# Patient Record
Sex: Male | Born: 1975 | Race: Black or African American | Hispanic: No | Marital: Single | Smoking: Current every day smoker
Health system: Southern US, Community
[De-identification: ages and names within clinical notes are randomized; demographics above are authoritative.]

## PROBLEM LIST (undated history)

## (undated) DIAGNOSIS — J45909 Unspecified asthma, uncomplicated: Secondary | ICD-10-CM

## (undated) HISTORY — PX: OTHER SURGICAL HISTORY: SHX169

---

## 2004-02-23 ENCOUNTER — Emergency Department: Payer: Self-pay | Admitting: Emergency Medicine

## 2005-03-13 ENCOUNTER — Emergency Department: Payer: Self-pay | Admitting: Emergency Medicine

## 2005-03-30 ENCOUNTER — Emergency Department: Payer: Self-pay | Admitting: General Practice

## 2005-04-01 ENCOUNTER — Emergency Department: Payer: Self-pay | Admitting: Emergency Medicine

## 2005-04-26 ENCOUNTER — Emergency Department: Payer: Self-pay | Admitting: Emergency Medicine

## 2005-05-28 ENCOUNTER — Emergency Department (HOSPITAL_COMMUNITY): Admission: EM | Admit: 2005-05-28 | Discharge: 2005-05-29 | Payer: Self-pay | Admitting: Emergency Medicine

## 2005-07-30 ENCOUNTER — Emergency Department (HOSPITAL_COMMUNITY): Admission: EM | Admit: 2005-07-30 | Discharge: 2005-07-30 | Payer: Self-pay | Admitting: Emergency Medicine

## 2005-08-07 ENCOUNTER — Emergency Department (HOSPITAL_COMMUNITY): Admission: EM | Admit: 2005-08-07 | Discharge: 2005-08-07 | Payer: Self-pay | Admitting: Emergency Medicine

## 2005-08-09 ENCOUNTER — Emergency Department: Payer: Self-pay | Admitting: Emergency Medicine

## 2005-10-17 ENCOUNTER — Emergency Department: Payer: Self-pay | Admitting: Emergency Medicine

## 2005-10-19 ENCOUNTER — Emergency Department (HOSPITAL_COMMUNITY): Admission: EM | Admit: 2005-10-19 | Discharge: 2005-10-19 | Payer: Self-pay | Admitting: Emergency Medicine

## 2005-12-17 ENCOUNTER — Other Ambulatory Visit: Payer: Self-pay

## 2005-12-17 ENCOUNTER — Emergency Department: Payer: Self-pay | Admitting: Unknown Physician Specialty

## 2006-02-22 ENCOUNTER — Emergency Department: Payer: Self-pay | Admitting: Emergency Medicine

## 2006-05-20 ENCOUNTER — Emergency Department: Payer: Self-pay | Admitting: Emergency Medicine

## 2006-05-21 ENCOUNTER — Emergency Department (HOSPITAL_COMMUNITY): Admission: EM | Admit: 2006-05-21 | Discharge: 2006-05-22 | Payer: Self-pay | Admitting: Emergency Medicine
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
## 2008-03-05 ENCOUNTER — Emergency Department: Payer: Self-pay | Admitting: Emergency Medicine

## 2008-10-14 ENCOUNTER — Emergency Department (HOSPITAL_COMMUNITY): Admission: EM | Admit: 2008-10-14 | Discharge: 2008-10-14 | Payer: Self-pay | Admitting: Emergency Medicine

## 2008-10-23 ENCOUNTER — Emergency Department: Payer: Self-pay | Admitting: Emergency Medicine

## 2009-02-20 ENCOUNTER — Emergency Department (HOSPITAL_COMMUNITY): Admission: EM | Admit: 2009-02-20 | Discharge: 2009-02-20 | Payer: Self-pay | Admitting: Emergency Medicine

## 2009-05-04 ENCOUNTER — Emergency Department: Payer: Self-pay

## 2009-05-13 ENCOUNTER — Emergency Department: Payer: Self-pay | Admitting: Emergency Medicine

## 2009-10-29 ENCOUNTER — Emergency Department: Payer: Self-pay | Admitting: Emergency Medicine

## 2010-01-05 ENCOUNTER — Emergency Department: Payer: Self-pay | Admitting: Emergency Medicine

## 2010-01-06 ENCOUNTER — Emergency Department: Payer: Self-pay | Admitting: Emergency Medicine

## 2010-08-17 LAB — URINE MICROSCOPIC-ADD ON

## 2010-08-17 LAB — URINALYSIS, ROUTINE W REFLEX MICROSCOPIC
Nitrite: NEGATIVE
Urobilinogen, UA: 1 mg/dL (ref 0.0–1.0)

## 2010-08-17 LAB — GC/CHLAMYDIA PROBE AMP, GENITAL: GC Probe Amp, Genital: POSITIVE — AB

## 2010-08-17 LAB — URINE CULTURE

## 2011-07-12 ENCOUNTER — Emergency Department: Payer: Self-pay | Admitting: Emergency Medicine

## 2012-07-21 ENCOUNTER — Encounter (HOSPITAL_COMMUNITY): Payer: Self-pay | Admitting: *Deleted

## 2012-07-21 ENCOUNTER — Emergency Department (HOSPITAL_COMMUNITY): Payer: Self-pay

## 2012-07-21 ENCOUNTER — Emergency Department (HOSPITAL_COMMUNITY)
Admission: EM | Admit: 2012-07-21 | Discharge: 2012-07-21 | Disposition: A | Discharge status: Home / Self Care | Payer: No Typology Code available for payment source | Attending: Emergency Medicine | Admitting: Emergency Medicine

## 2012-07-21 DIAGNOSIS — S139XXA Sprain of joints and ligaments of unspecified parts of neck, initial encounter: Secondary | ICD-10-CM | POA: Insufficient documentation

## 2012-07-21 DIAGNOSIS — IMO0002 Reserved for concepts with insufficient information to code with codable children: Secondary | ICD-10-CM | POA: Insufficient documentation

## 2012-07-21 DIAGNOSIS — H538 Other visual disturbances: Secondary | ICD-10-CM | POA: Insufficient documentation

## 2012-07-21 DIAGNOSIS — Y92410 Unspecified street and highway as the place of occurrence of the external cause: Secondary | ICD-10-CM | POA: Insufficient documentation

## 2012-07-21 DIAGNOSIS — Y9389 Activity, other specified: Secondary | ICD-10-CM | POA: Insufficient documentation

## 2012-07-21 DIAGNOSIS — R110 Nausea: Secondary | ICD-10-CM | POA: Insufficient documentation

## 2012-07-21 DIAGNOSIS — F17200 Nicotine dependence, unspecified, uncomplicated: Secondary | ICD-10-CM | POA: Insufficient documentation

## 2012-07-21 DIAGNOSIS — Z8739 Personal history of other diseases of the musculoskeletal system and connective tissue: Secondary | ICD-10-CM | POA: Insufficient documentation

## 2012-07-21 DIAGNOSIS — R42 Dizziness and giddiness: Secondary | ICD-10-CM | POA: Insufficient documentation

## 2012-07-21 DIAGNOSIS — S43401A Unspecified sprain of right shoulder joint, initial encounter: Secondary | ICD-10-CM

## 2012-07-21 MED ORDER — DIAZEPAM 5 MG PO TABS: 5.0000 mg | ORAL_TABLET | Freq: Two times a day (BID) | ORAL | Status: DC

## 2012-07-21 MED ORDER — HYDROMORPHONE HCL PF 1 MG/ML IJ SOLN
1.0000 mg | Freq: Once | INTRAMUSCULAR | Status: AC
  Administered 2012-07-21: 1 mg via INTRAMUSCULAR
  Filled 2012-07-21: qty 1

## 2012-07-21 MED ORDER — KETOROLAC TROMETHAMINE 60 MG/2ML IM SOLN
60.0000 mg | Freq: Once | INTRAMUSCULAR | Status: AC
  Administered 2012-07-21: 60 mg via INTRAMUSCULAR
  Filled 2012-07-21: qty 2

## 2012-07-21 MED ORDER — ONDANSETRON 4 MG PO TBDP
8.0000 mg | ORAL_TABLET | Freq: Once | ORAL | Status: AC
  Administered 2012-07-21: 8 mg via ORAL
  Filled 2012-07-21: qty 2

## 2012-07-21 MED ORDER — OXYCODONE HCL 5 MG PO TABS: 5.0000 mg | ORAL_TABLET | ORAL | Status: DC | PRN

## 2012-07-21 NOTE — ED Notes (Signed)
The pt upset at the wait time.  Hurting  And he has had his xrays
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
## 2012-07-23 NOTE — ED Provider Notes (Signed)
Medical screening examination/treatment/procedure(s) were performed by non-physician practitioner and as supervising physician I was immediately available for consultation/collaboration.  Raeford Razor, MD 07/23/12 2113

## 2012-08-28 ENCOUNTER — Encounter (HOSPITAL_COMMUNITY): Payer: Self-pay | Admitting: Emergency Medicine

## 2012-08-28 DIAGNOSIS — W1809XA Striking against other object with subsequent fall, initial encounter: Secondary | ICD-10-CM | POA: Insufficient documentation

## 2012-08-28 DIAGNOSIS — Y939 Activity, unspecified: Secondary | ICD-10-CM | POA: Insufficient documentation

## 2012-08-28 DIAGNOSIS — Y929 Unspecified place or not applicable: Secondary | ICD-10-CM | POA: Insufficient documentation

## 2012-08-28 DIAGNOSIS — F17200 Nicotine dependence, unspecified, uncomplicated: Secondary | ICD-10-CM | POA: Insufficient documentation

## 2012-08-28 DIAGNOSIS — Z79899 Other long term (current) drug therapy: Secondary | ICD-10-CM | POA: Insufficient documentation

## 2012-08-28 DIAGNOSIS — R269 Unspecified abnormalities of gait and mobility: Secondary | ICD-10-CM | POA: Insufficient documentation

## 2012-08-28 DIAGNOSIS — IMO0002 Reserved for concepts with insufficient information to code with codable children: Secondary | ICD-10-CM | POA: Insufficient documentation

## 2012-08-28 DIAGNOSIS — J45909 Unspecified asthma, uncomplicated: Secondary | ICD-10-CM | POA: Insufficient documentation

## 2012-08-28 NOTE — ED Notes (Signed)
PT. REPORTS LOW BACK PAIN ONSET THIS EVENING WHILE LIFTING HEAVY FURNITURES.

## 2012-08-29 ENCOUNTER — Emergency Department (HOSPITAL_COMMUNITY)
Admission: EM | Admit: 2012-08-29 | Discharge: 2012-08-29 | Disposition: A | Discharge status: Home / Self Care | Payer: Self-pay | Attending: Emergency Medicine | Admitting: Emergency Medicine

## 2012-08-29 DIAGNOSIS — M62830 Muscle spasm of back: Secondary | ICD-10-CM

## 2012-08-29 DIAGNOSIS — M5432 Sciatica, left side: Secondary | ICD-10-CM

## 2012-08-29 DIAGNOSIS — M545 Low back pain: Secondary | ICD-10-CM

## 2012-08-29 HISTORY — DX: Unspecified asthma, uncomplicated: J45.909

## 2012-08-29 MED ORDER — NAPROXEN 500 MG PO TABS: 500.0000 mg | ORAL_TABLET | Freq: Two times a day (BID) | ORAL | Status: DC | PRN

## 2012-08-29 MED ORDER — OXYCODONE HCL 5 MG PO TABS
10.0000 mg | ORAL_TABLET | Freq: Once | ORAL | Status: AC
  Administered 2012-08-29: 10 mg via ORAL
  Filled 2012-08-29: qty 2

## 2012-08-29 MED ORDER — HYDROCODONE-ACETAMINOPHEN 5-325 MG PO TABS: 1.0000 | ORAL_TABLET | Freq: Four times a day (QID) | ORAL | Status: DC | PRN

## 2012-08-29 MED ORDER — OXYCODONE HCL 5 MG PO TABS: 5.0000 mg | ORAL_TABLET | Freq: Three times a day (TID) | ORAL | Status: DC | PRN

## 2012-08-29 MED ORDER — DIAZEPAM 5 MG PO TABS
10.0000 mg | ORAL_TABLET | Freq: Once | ORAL | Status: AC
  Administered 2012-08-29: 10 mg via ORAL
  Filled 2012-08-29: qty 2

## 2012-08-29 MED ORDER — METHOCARBAMOL 750 MG PO TABS: 750.0000 mg | ORAL_TABLET | Freq: Four times a day (QID) | ORAL | Status: DC | PRN

## 2012-08-29 NOTE — ED Provider Notes (Signed)
Medical screening examination/treatment/procedure(s) were performed by non-physician practitioner and as supervising physician I was immediately available for consultation/collaboration.   Anelle Parlow L Devion Chriscoe, MD 08/29/12 0552 

## 2012-08-29 NOTE — ED Provider Notes (Signed)
History     CSN: [id number]  Arrival date & time 08/28/12  2337   First MD Initiated Contact with Patient 08/29/12 0235      Chief Complaint  Patient presents with  . Back Pain    (Consider location/radiation/quality/duration/timing/severity/associated sxs/prior treatment) Patient is a 37 y.o. male presenting with back pain. The history is provided by the patient and medical records. No language interpreter was used.  Back Pain Location:  Lumbar spine Quality:  Aching, burning and cramping Radiates to:  L posterior upper leg Pain severity:  Moderate Pain is:  Unable to specify Onset quality:  Gradual Duration:  4 hours Timing:  Constant Progression:  Worsening Chronicity:  New Context: lifting heavy objects   Context: not falling, not jumping from heights, not occupational injury, not pedestrian accident, not physical stress, not recent illness, not recent injury and not twisting   Relieved by:  None tried Worsened by:  Bending, movement, palpation, sitting and standing Ineffective treatments:  None tried Associated symptoms: no abdominal pain, no bladder incontinence, no bowel incontinence, no chest pain, no dysuria, no fever, no headaches, no leg pain, no numbness, no paresthesias, no perianal numbness, no tingling, no weakness and no weight loss   Risk factors: obesity   Risk factors: no hx of cancer, no recent surgery and no vascular disease     Mark Cruz is a 37 y.o. male  with a hx of asthma presents to the Emergency Department complaining of gradual, persistent, progressively worsening Left sided low back pain onset 4 hours prior to arrival after moving a heavy dresser.  Pt without Hx of back injury or surgery. Pt denies numbness, tingling, loss of bowel or bladder function, difficulty ambulating, loss of leg function.  Pt states pain is sharp, rated at a 10/10 and radiating down the L leg. Associated symptoms include pain in low back with radiation down leg.  Nothing  makes it better and laying on his back makes it worse.  Pt denies fever, chills, headache, neck pain, abdominal pain, nausea, vomiting, diarrhea, weakness, dizziness, syncope, dysuria.  He denies fall or trauma to his back.   Past Medical History  Diagnosis Date  . Asthma     History reviewed. No pertinent past surgical history.  No family history on file.  History  Substance Use Topics  . Smoking status: Current Every Day Smoker  . Smokeless tobacco: Not on file  . Alcohol Use: No      Review of Systems  Constitutional: Negative for fever, weight loss and fatigue.  HENT: Negative for neck pain and neck stiffness.   Respiratory: Negative for chest tightness and shortness of breath.   Cardiovascular: Negative for chest pain.  Gastrointestinal: Negative for nausea, vomiting, abdominal pain, diarrhea and bowel incontinence.  Genitourinary: Negative for bladder incontinence, dysuria, urgency, frequency and hematuria.  Musculoskeletal: Positive for back pain and gait problem ( 2/2 pain ). Negative for joint swelling and arthralgias.  Skin: Negative for rash.  Neurological: Negative for tingling, weakness, light-headedness, numbness, headaches and paresthesias.  All other systems reviewed and are negative.    Allergies  Fish allergy and Tylenol  Home Medications   Current Outpatient Rx  Name  Route  Sig  Dispense  Refill  . albuterol (PROVENTIL HFA;VENTOLIN HFA) 108 (90 BASE) MCG/ACT inhaler   Inhalation   Inhale 2 puffs into the lungs every 6 (six) hours as needed for wheezing.         Marland Kitchen HYDROcodone-acetaminophen (NORCO/VICODIN) 5-325 MG per  tablet   Oral   Take 1 tablet by mouth every 6 (six) hours as needed for pain (Take 1 - 2 tablets every 4 - 6 hours.).   10 tablet   0   . methocarbamol (ROBAXIN) 750 MG tablet   Oral   Take 1 tablet (750 mg total) by mouth 4 (four) times daily as needed (Take 1 tablet every 6 hours as needed for muscle spasms.).   20 tablet    0   . naproxen (NAPROSYN) 500 MG tablet   Oral   Take 1 tablet (500 mg total) by mouth 2 (two) times daily as needed.   30 tablet   0     BP 154/87  Pulse 75  Temp(Src) 99.1 F (37.3 C) (Oral)  Resp 14  SpO2 97%  Physical Exam  Nursing note and vitals reviewed. Constitutional: He is oriented to person, place, and time. He appears well-developed and well-nourished. No distress.  HENT:  Head: Normocephalic and atraumatic.  Mouth/Throat: Oropharynx is clear and moist. No oropharyngeal exudate.  Eyes: Conjunctivae are normal. Pupils are equal, round, and reactive to light.  Neck: Normal range of motion and full passive range of motion without pain. Neck supple. No spinous process tenderness and no muscular tenderness present. Normal range of motion present.  Full ROM without pain  Cardiovascular: Normal rate, regular rhythm, normal heart sounds and intact distal pulses.   No murmur heard. Pulmonary/Chest: Effort normal and breath sounds normal. No respiratory distress. He has no wheezes.  Abdominal: Soft. He exhibits no distension. There is no tenderness.  Musculoskeletal: He exhibits tenderness.       Lumbar back: He exhibits tenderness, pain and spasm. He exhibits no bony tenderness, no swelling, no edema, no deformity and no laceration.       Back:  Full range of motion of the T-spine and mildly decreased ROM of the L-spine 2/2 pain No tenderness to palpation of the spinous processes of the T-spine or L-spine Mild tenderness to palpation of the paraspinous muscles of the Left side of the L-spine and Left SI joint, reproducing radiation of pain  Lymphadenopathy:    He has no cervical adenopathy.  Neurological: He is alert and oriented to person, place, and time. He has normal reflexes. He exhibits normal muscle tone. Coordination normal.  Speech is clear and goal oriented, follows commands Normal strength in upper and lower extremities bilaterally including dorsiflexion and  plantar flexion, strong and equal grip strength Sensation normal to light and sharp touch Moves extremities without ataxia, coordination intact Normal gait Normal balance   Skin: Skin is warm and dry. No rash noted. He is not diaphoretic. No erythema.    ED Course  Procedures (including critical care time)  Labs Reviewed - No data to display No results found.   1. Back muscle spasm   2. Low back pain   3. Sciatica, left [724.3]       MDM  Ames Coupe presents with back pain; history and physical consistent with sciatica. Patient with back pain 2/2 heavy lifting.  No neurological deficits and normal neuro exam.  Patient can walk but states is painful.  No loss of bowel or bladder control.  No concern for cauda equina.  No fever, night sweats, weight loss, h/o cancer, IVDU.  No fall or trauma; no imaging indicated at this time.  RICE protocol and pain medicine indicated and discussed with patient.   1. Medications: robaxin, naproxyn, vicodin, usual home medications 2. Treatment:  rest, drink plenty of fluids, gentle stretching as discussed, alternate ice and heat 3. Follow Up: Please followup with your primary doctor for discussion of your diagnoses and further evaluation after today's visit; if you do not have a primary care doctor use the resource guide provided to find one;           Dierdre Forth, PA-C 08/29/12 0335

## 2012-08-30 ENCOUNTER — Emergency Department (HOSPITAL_COMMUNITY): Payer: Self-pay

## 2012-08-30 ENCOUNTER — Encounter (HOSPITAL_COMMUNITY): Payer: Self-pay | Admitting: *Deleted

## 2012-08-30 ENCOUNTER — Emergency Department (HOSPITAL_COMMUNITY)
Admission: EM | Admit: 2012-08-30 | Discharge: 2012-08-30 | Disposition: A | Discharge status: Home / Self Care | Payer: Self-pay | Attending: Emergency Medicine | Admitting: Emergency Medicine

## 2012-08-30 DIAGNOSIS — G8911 Acute pain due to trauma: Secondary | ICD-10-CM | POA: Insufficient documentation

## 2012-08-30 DIAGNOSIS — J45909 Unspecified asthma, uncomplicated: Secondary | ICD-10-CM | POA: Insufficient documentation

## 2012-08-30 DIAGNOSIS — M5430 Sciatica, unspecified side: Secondary | ICD-10-CM | POA: Insufficient documentation

## 2012-08-30 DIAGNOSIS — M545 Low back pain: Secondary | ICD-10-CM

## 2012-08-30 DIAGNOSIS — S39012A Strain of muscle, fascia and tendon of lower back, initial encounter: Secondary | ICD-10-CM

## 2012-08-30 DIAGNOSIS — Z87828 Personal history of other (healed) physical injury and trauma: Secondary | ICD-10-CM | POA: Insufficient documentation

## 2012-08-30 DIAGNOSIS — F17200 Nicotine dependence, unspecified, uncomplicated: Secondary | ICD-10-CM | POA: Insufficient documentation

## 2012-08-30 MED ORDER — TRAMADOL HCL 50 MG PO TABS
50.0000 mg | ORAL_TABLET | Freq: Once | ORAL | Status: AC
  Administered 2012-08-30: 50 mg via ORAL
  Filled 2012-08-30: qty 1

## 2012-08-30 MED ORDER — PREDNISONE 20 MG PO TABS: ORAL_TABLET | ORAL | Status: DC

## 2012-08-30 MED ORDER — TRAMADOL HCL 50 MG PO TABS: 50.0000 mg | ORAL_TABLET | Freq: Four times a day (QID) | ORAL | Status: DC | PRN

## 2012-08-30 MED ORDER — PREDNISONE 20 MG PO TABS
60.0000 mg | ORAL_TABLET | Freq: Once | ORAL | Status: AC
  Administered 2012-08-30: 60 mg via ORAL
  Filled 2012-08-30: qty 3

## 2012-08-30 NOTE — ED Provider Notes (Signed)
History     CSN: [id number]  Arrival date & time 08/30/12  1430   First MD Initiated Contact with Patient 08/30/12 1508      Chief Complaint  Patient presents with  . Back Pain    (Consider location/radiation/quality/duration/timing/severity/associated sxs/prior treatment) Patient is a 37 y.o. male presenting with back pain. The history is provided by the patient.  Back Pain Associated symptoms: no abdominal pain, no dysuria, no fever, no numbness and no weakness   pt indicates 2 days ago was lifting heavy dresser, noted pull, pain, strain to lower back. C/o dull, mod-sev lower back pain since that occasionally radiates towards bil buttocks/upper legs posteriorly. Worse w bending and certain movement.  No leg numbness or weakness. No perineal or saddle numbness or loss of sensation. No urinary incontinence or retention. No hx ddd. State he was seen in ed prior but no xrays done and was only given 1-2 days of pain meds which he is out of. No reinjury since initial injury. Ambulatory. No fever or chills. Denies any other pain or injury.   Past Medical History  Diagnosis Date  . Asthma     History reviewed. No pertinent past surgical history.  History reviewed. No pertinent family history.  History  Substance Use Topics  . Smoking status: Current Every Day Smoker  . Smokeless tobacco: Not on file  . Alcohol Use: No      Review of Systems  Constitutional: Negative for fever and chills.  HENT: Negative for neck pain.   Gastrointestinal: Negative for nausea, vomiting, abdominal pain and abdominal distention.  Genitourinary: Negative for dysuria and hematuria.  Musculoskeletal: Positive for back pain.  Skin: Negative for rash.  Neurological: Negative for weakness and numbness.    Allergies  Fish allergy and Tylenol  Home Medications   Current Outpatient Rx  Name  Route  Sig  Dispense  Refill  . albuterol (PROVENTIL HFA;VENTOLIN HFA) 108 (90 BASE) MCG/ACT inhaler  Inhalation   Inhale 2 puffs into the lungs every 6 (six) hours as needed for wheezing.         . cyclobenzaprine (FLEXERIL) 10 MG tablet   Oral   Take 10 mg by mouth 3 (three) times daily as needed for muscle spasms.         Marland Kitchen oxyCODONE (OXY IR/ROXICODONE) 5 MG immediate release tablet   Oral   Take 5 mg by mouth every 8 (eight) hours as needed for pain.           BP 142/89  Pulse 73  Temp(Src) 98.4 F (36.9 C) (Oral)  Resp 18  SpO2 100%  Physical Exam  Nursing note and vitals reviewed. Constitutional: He is oriented to person, place, and time. He appears well-developed and well-nourished. No distress.  HENT:  Head: Atraumatic.  Eyes: Pupils are equal, round, and reactive to light.  Neck: Neck supple. No tracheal deviation present.  Cardiovascular: Normal rate.   Pulmonary/Chest: Effort normal. No accessory muscle usage. No respiratory distress.  Abdominal: Soft. He exhibits no distension. There is no tenderness.  Musculoskeletal: Normal range of motion. He exhibits no edema and no tenderness.  Mid to lower lumbar tenderness, otherwise CTLS spine, non tender, aligned, no step off.   Neurological: He is alert and oriented to person, place, and time. He displays normal reflexes.  Straight leg raise neg bil. Motor 5/5. Normal dorsi/plantar flexion at ankles, and great toe. Ambulates w steady gait. Sensation grossly intact/normal.    Skin: Skin is warm and  dry.  Psychiatric: He has a normal mood and affect.    ED Course  Procedures (including critical care time)   Dg Lumbar Spine Complete  08/30/2012  *RADIOLOGY REPORT*  Clinical Data: Injured back.  LUMBAR SPINE - COMPLETE 4+ VIEW  Comparison: None  Findings: The lateral film demonstrates normal alignment. Vertebral bodies and disc spaces are maintained.  No acute bony findings.  Normal alignment of the facet joints and no pars defects.  The visualized bony pelvis in intact.  IMPRESSION: Normal alignment and no acute  bony findings or degenerative changes.   Original Report Authenticated By: Rudie Meyer, M.D.       MDM  Pt has ride, does not have to drive. No meds pta. Ultram po. pred po.  Xr.  Reviewed nursing notes and prior charts for additional history.    Recheck pt comfortable. Appears stable for d/c. Discussed diff dx incl ddd, hern disc, muscle strain/spasm, sciatica, and need for close pcp follow up.   Pt appears stable for d/c.        Suzi Roots, MD 08/30/12 [phone]

## 2012-08-30 NOTE — ED Notes (Signed)
Patient transported to X-ray 

## 2012-08-30 NOTE — ED Notes (Signed)
Pt was seen here recently for same. Was doing heavy lifting on Thursday and now  Having lower back pain that is radiating down bilateral legs. Pt is out of pain meds. Ambulatory at triage.

## 2012-10-28 ENCOUNTER — Encounter (HOSPITAL_COMMUNITY): Payer: Self-pay | Admitting: Physical Medicine and Rehabilitation

## 2012-10-28 ENCOUNTER — Emergency Department (HOSPITAL_COMMUNITY)
Admission: EM | Admit: 2012-10-28 | Discharge: 2012-10-28 | Disposition: A | Discharge status: Home / Self Care | Payer: Self-pay | Attending: Emergency Medicine | Admitting: Emergency Medicine

## 2012-10-28 ENCOUNTER — Telehealth (HOSPITAL_COMMUNITY): Payer: Self-pay | Admitting: *Deleted

## 2012-10-28 ENCOUNTER — Emergency Department (HOSPITAL_COMMUNITY): Payer: Self-pay

## 2012-10-28 DIAGNOSIS — J45909 Unspecified asthma, uncomplicated: Secondary | ICD-10-CM | POA: Insufficient documentation

## 2012-10-28 DIAGNOSIS — S0990XA Unspecified injury of head, initial encounter: Secondary | ICD-10-CM | POA: Insufficient documentation

## 2012-10-28 DIAGNOSIS — Z79899 Other long term (current) drug therapy: Secondary | ICD-10-CM | POA: Insufficient documentation

## 2012-10-28 DIAGNOSIS — H538 Other visual disturbances: Secondary | ICD-10-CM | POA: Insufficient documentation

## 2012-10-28 DIAGNOSIS — F17200 Nicotine dependence, unspecified, uncomplicated: Secondary | ICD-10-CM | POA: Insufficient documentation

## 2012-10-28 MED ORDER — OXYCODONE HCL 5 MG PO TABS
5.0000 mg | ORAL_TABLET | Freq: Once | ORAL | Status: AC
  Administered 2012-10-28: 5 mg via ORAL
  Filled 2012-10-28: qty 1

## 2012-10-28 MED ORDER — OXYCODONE HCL 5 MG PO TABS: 5.0000 mg | ORAL_TABLET | ORAL | Status: DC | PRN

## 2012-10-28 NOTE — ED Notes (Signed)
Patient transported to CT 
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
## 2012-10-29 NOTE — ED Provider Notes (Signed)
Medical screening examination/treatment/procedure(s) were performed by non-physician practitioner and as supervising physician I was immediately available for consultation/collaboration.   Lindalee Huizinga M Joesiah Lonon, DO 10/29/12 0950 

## 2012-11-20 ENCOUNTER — Emergency Department (HOSPITAL_COMMUNITY)
Admission: EM | Admit: 2012-11-20 | Discharge: 2012-11-20 | Disposition: A | Discharge status: Home / Self Care | Payer: Self-pay | Attending: Emergency Medicine | Admitting: Emergency Medicine

## 2012-11-20 ENCOUNTER — Encounter (HOSPITAL_COMMUNITY): Payer: Self-pay | Admitting: *Deleted

## 2012-11-20 DIAGNOSIS — Z87828 Personal history of other (healed) physical injury and trauma: Secondary | ICD-10-CM | POA: Insufficient documentation

## 2012-11-20 DIAGNOSIS — J45909 Unspecified asthma, uncomplicated: Secondary | ICD-10-CM | POA: Insufficient documentation

## 2012-11-20 DIAGNOSIS — R51 Headache: Secondary | ICD-10-CM | POA: Insufficient documentation

## 2012-11-20 DIAGNOSIS — H53149 Visual discomfort, unspecified: Secondary | ICD-10-CM | POA: Insufficient documentation

## 2012-11-20 DIAGNOSIS — Z79899 Other long term (current) drug therapy: Secondary | ICD-10-CM | POA: Insufficient documentation

## 2012-11-20 DIAGNOSIS — F17200 Nicotine dependence, unspecified, uncomplicated: Secondary | ICD-10-CM | POA: Insufficient documentation

## 2012-11-20 MED ORDER — CYCLOBENZAPRINE HCL 10 MG PO TABS: 10.0000 mg | ORAL_TABLET | Freq: Two times a day (BID) | ORAL | Status: DC | PRN

## 2012-11-20 MED ORDER — METOCLOPRAMIDE HCL 5 MG/ML IJ SOLN
10.0000 mg | Freq: Once | INTRAMUSCULAR | Status: AC
  Administered 2012-11-20: 10 mg via INTRAVENOUS
  Filled 2012-11-20: qty 2

## 2012-11-20 MED ORDER — HYDROMORPHONE HCL PF 1 MG/ML IJ SOLN
1.0000 mg | Freq: Once | INTRAMUSCULAR | Status: AC
  Administered 2012-11-20: 1 mg via INTRAVENOUS
  Filled 2012-11-20: qty 1

## 2012-11-20 MED ORDER — SODIUM CHLORIDE 0.9 % IV BOLUS (SEPSIS)
1000.0000 mL | Freq: Once | INTRAVENOUS | Status: AC
  Administered 2012-11-20: 1000 mL via INTRAVENOUS

## 2012-11-20 MED ORDER — DIPHENHYDRAMINE HCL 50 MG/ML IJ SOLN
25.0000 mg | Freq: Once | INTRAMUSCULAR | Status: AC
  Administered 2012-11-20: 25 mg via INTRAVENOUS
  Filled 2012-11-20: qty 1

## 2012-11-20 NOTE — ED Provider Notes (Signed)
Medical screening examination/treatment/procedure(s) were performed by non-physician practitioner and as supervising physician I was immediately available for consultation/collaboration.   Charles B. Bernette Mayers, MD 11/20/12 1118
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
## 2012-12-07 ENCOUNTER — Emergency Department (HOSPITAL_COMMUNITY)
Admission: EM | Admit: 2012-12-07 | Discharge: 2012-12-07 | Disposition: A | Discharge status: Home / Self Care | Payer: Self-pay | Attending: Emergency Medicine | Admitting: Emergency Medicine

## 2012-12-07 ENCOUNTER — Encounter (HOSPITAL_COMMUNITY): Payer: Self-pay | Admitting: Emergency Medicine

## 2012-12-07 DIAGNOSIS — F17200 Nicotine dependence, unspecified, uncomplicated: Secondary | ICD-10-CM | POA: Insufficient documentation

## 2012-12-07 DIAGNOSIS — R51 Headache: Secondary | ICD-10-CM | POA: Insufficient documentation

## 2012-12-07 DIAGNOSIS — J45909 Unspecified asthma, uncomplicated: Secondary | ICD-10-CM | POA: Insufficient documentation

## 2012-12-07 DIAGNOSIS — Z79899 Other long term (current) drug therapy: Secondary | ICD-10-CM | POA: Insufficient documentation

## 2012-12-07 DIAGNOSIS — Z87828 Personal history of other (healed) physical injury and trauma: Secondary | ICD-10-CM | POA: Insufficient documentation

## 2012-12-07 MED ORDER — SUMATRIPTAN SUCCINATE 25 MG PO TABS: 25.0000 mg | ORAL_TABLET | ORAL | Status: DC | PRN

## 2012-12-07 MED ORDER — METOCLOPRAMIDE HCL 5 MG/ML IJ SOLN
10.0000 mg | Freq: Once | INTRAMUSCULAR | Status: AC
  Administered 2012-12-07: 10 mg via INTRAVENOUS
  Filled 2012-12-07: qty 2

## 2012-12-07 MED ORDER — DIPHENHYDRAMINE HCL 50 MG/ML IJ SOLN
25.0000 mg | Freq: Once | INTRAMUSCULAR | Status: DC
  Filled 2012-12-07: qty 1

## 2012-12-07 NOTE — ED Notes (Signed)
PT. REPORTS INTERMITTENT HEADACHE ONSET Friday , DENIES INJURY OR FALL , ALERT AND ORIENTED , DENIES NAUSEA OR BLURRED VISION .
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
## 2012-12-08 NOTE — ED Provider Notes (Signed)
Medical screening examination/treatment/procedure(s) were performed by non-physician practitioner and as supervising physician I was immediately available for consultation/collaboration.  Aleyna Cueva, MD 12/08/12 1834 

## 2012-12-09 ENCOUNTER — Emergency Department (HOSPITAL_COMMUNITY)
Admission: EM | Admit: 2012-12-09 | Discharge: 2012-12-09 | Disposition: A | Discharge status: Home / Self Care | Payer: No Typology Code available for payment source | Attending: Emergency Medicine | Admitting: Emergency Medicine

## 2012-12-09 ENCOUNTER — Encounter (HOSPITAL_COMMUNITY): Payer: Self-pay | Admitting: Emergency Medicine

## 2012-12-09 ENCOUNTER — Emergency Department (HOSPITAL_COMMUNITY): Payer: No Typology Code available for payment source

## 2012-12-09 DIAGNOSIS — S060X1A Concussion with loss of consciousness of 30 minutes or less, initial encounter: Secondary | ICD-10-CM | POA: Insufficient documentation

## 2012-12-09 DIAGNOSIS — F17200 Nicotine dependence, unspecified, uncomplicated: Secondary | ICD-10-CM | POA: Insufficient documentation

## 2012-12-09 DIAGNOSIS — S0990XA Unspecified injury of head, initial encounter: Secondary | ICD-10-CM | POA: Insufficient documentation

## 2012-12-09 DIAGNOSIS — S0003XA Contusion of scalp, initial encounter: Secondary | ICD-10-CM

## 2012-12-09 DIAGNOSIS — Y92410 Unspecified street and highway as the place of occurrence of the external cause: Secondary | ICD-10-CM | POA: Insufficient documentation

## 2012-12-09 DIAGNOSIS — M542 Cervicalgia: Secondary | ICD-10-CM

## 2012-12-09 DIAGNOSIS — S0993XA Unspecified injury of face, initial encounter: Secondary | ICD-10-CM | POA: Insufficient documentation

## 2012-12-09 DIAGNOSIS — Y9389 Activity, other specified: Secondary | ICD-10-CM | POA: Insufficient documentation

## 2012-12-09 DIAGNOSIS — J45909 Unspecified asthma, uncomplicated: Secondary | ICD-10-CM | POA: Insufficient documentation

## 2012-12-09 MED ORDER — METHOCARBAMOL 500 MG PO TABS: 500.0000 mg | ORAL_TABLET | Freq: Three times a day (TID) | ORAL | Status: DC

## 2012-12-09 MED ORDER — CYCLOBENZAPRINE HCL 10 MG PO TABS: 10.0000 mg | ORAL_TABLET | Freq: Two times a day (BID) | ORAL | Status: DC | PRN

## 2012-12-09 MED ORDER — METHOCARBAMOL 500 MG PO TABS
500.0000 mg | ORAL_TABLET | Freq: Once | ORAL | Status: AC
  Administered 2012-12-09: 500 mg via ORAL
  Filled 2012-12-09: qty 1

## 2012-12-09 MED ORDER — CYCLOBENZAPRINE HCL 10 MG PO TABS: 10.0000 mg | ORAL_TABLET | Freq: Once | ORAL | Status: DC
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
## 2012-12-10 NOTE — ED Provider Notes (Signed)
Medical screening examination/treatment/procedure(s) were performed by non-physician practitioner and as supervising physician I was immediately available for consultation/collaboration.   Tzion Wedel, MD 12/10/12 1519 

## 2013-01-20 ENCOUNTER — Encounter (HOSPITAL_COMMUNITY): Payer: Self-pay | Admitting: Emergency Medicine

## 2013-01-20 ENCOUNTER — Emergency Department (HOSPITAL_COMMUNITY)
Admission: EM | Admit: 2013-01-20 | Discharge: 2013-01-21 | Disposition: A | Discharge status: Home / Self Care | Payer: Self-pay | Attending: Emergency Medicine | Admitting: Emergency Medicine

## 2013-01-20 DIAGNOSIS — J45909 Unspecified asthma, uncomplicated: Secondary | ICD-10-CM | POA: Insufficient documentation

## 2013-01-20 DIAGNOSIS — M545 Low back pain, unspecified: Secondary | ICD-10-CM | POA: Insufficient documentation

## 2013-01-20 DIAGNOSIS — N390 Urinary tract infection, site not specified: Secondary | ICD-10-CM | POA: Insufficient documentation

## 2013-01-20 DIAGNOSIS — F17200 Nicotine dependence, unspecified, uncomplicated: Secondary | ICD-10-CM | POA: Insufficient documentation

## 2013-01-20 LAB — URINE MICROSCOPIC-ADD ON

## 2013-01-20 LAB — URINALYSIS, ROUTINE W REFLEX MICROSCOPIC
Glucose, UA: NEGATIVE mg/dL
pH: 6 (ref 5.0–8.0)

## 2013-01-20 MED ORDER — METRONIDAZOLE 500 MG PO TABS
2000.0000 mg | ORAL_TABLET | Freq: Once | ORAL | Status: AC
  Administered 2013-01-20: 2000 mg via ORAL
  Filled 2013-01-20: qty 4

## 2013-01-20 MED ORDER — CIPROFLOXACIN HCL 500 MG PO TABS: 500.0000 mg | ORAL_TABLET | Freq: Two times a day (BID) | ORAL | Status: DC

## 2013-01-20 MED ORDER — KETOROLAC TROMETHAMINE 30 MG/ML IJ SOLN
30.0000 mg | Freq: Once | INTRAMUSCULAR | Status: AC
  Administered 2013-01-20: 30 mg via INTRAMUSCULAR
  Filled 2013-01-20: qty 1

## 2013-01-20 MED ORDER — AZITHROMYCIN 250 MG PO TABS
1000.0000 mg | ORAL_TABLET | Freq: Once | ORAL | Status: AC
  Administered 2013-01-20: 1000 mg via ORAL
  Filled 2013-01-20: qty 3
  Filled 2013-01-20: qty 4

## 2013-01-20 MED ORDER — CEFTRIAXONE SODIUM 250 MG IJ SOLR
250.0000 mg | Freq: Once | INTRAMUSCULAR | Status: AC
  Administered 2013-01-20: 250 mg via INTRAMUSCULAR
  Filled 2013-01-20: qty 250

## 2013-01-20 NOTE — ED Provider Notes (Signed)
CSN: [id number]     Arrival date & time 01/20/13  2155 History   This chart was scribed for non-physician practitioner, Ivonne Andrew, PA-C, working with Lyanne Co, MD by Ronal Fear, ED scribe. This patient was seen in room WTR6/WTR6 and the patient's care was started at 11:04 PM.    Chief Complaint  Patient presents with  . Flank Pain   The history is provided by the patient. No language interpreter was used.    HPI Comments: History provided by the patient.  Mark Cruz is a 37 y.o. male who presents to the Emergency Department complaining of sharp back pain localized at his mid lower back.  Symptoms first began earlier today. They described as aching and spasm-type pain. Patient denies any strenuous activity or trauma. Denies similar symptoms previously. Patient has not used any treatment for symptoms. Denies any specific aggravating or alleviating factors. Pt is sexually active. Pt denies any STI symptom, fever, chills, nausea, emesis, penile discharge, tenderness to testicles, blood in urine.  Pt denies IV drug use and any other medical problems.       Past Medical History  Diagnosis Date  . Asthma    Past Surgical History  Procedure Laterality Date  . Orchectomy Left    History reviewed. No pertinent family history. History  Substance Use Topics  . Smoking status: Current Every Day Smoker -- 0.50 packs/day for 5 years    Types: Cigarettes  . Smokeless tobacco: Never Used  . Alcohol Use: Yes     Comment: socially    Review of Systems  Constitutional: Negative for fever and chills.  Gastrointestinal: Negative for blood in stool.  Genitourinary: Negative for dysuria, hematuria, discharge, penile swelling, scrotal swelling and testicular pain.  Musculoskeletal: Positive for back pain.  All other systems reviewed and are negative.    Allergies  Fish allergy; Tramadol; Tylenol; and Ibuprofen  Home Medications   Current Outpatient Rx  Name  Route  Sig  Dispense   Refill  . methocarbamol (ROBAXIN) 500 MG tablet   Oral   Take 1 tablet (500 mg total) by mouth 3 (three) times daily.   10 tablet   0    BP 141/86  Pulse 78  Temp(Src) 99 F (37.2 C) (Oral)  Resp 16  Ht 6\' 2"  (1.88 m)  Wt 225 lb (102.059 kg)  BMI 28.88 kg/m2  SpO2 100% Physical Exam  Nursing note and vitals reviewed. Constitutional: He is oriented to person, place, and time. He appears well-developed and well-nourished. No distress.  HENT:  Head: Normocephalic and atraumatic.  Eyes: EOM are normal.  Neck: Neck supple. No tracheal deviation present.  Cardiovascular: Normal rate.   Pulmonary/Chest: Effort normal. No respiratory distress.  Genitourinary: Penis normal.  No lymphadenopathy, normal right testicle, absent left testicle, penis circumcised otherwise normal, no discharge. Mild right sided cva tenderness  Musculoskeletal: Normal range of motion. He exhibits tenderness.  Neurological: He is alert and oriented to person, place, and time.  Skin: Skin is warm and dry.  Psychiatric: He has a normal mood and affect. His behavior is normal.    ED Course  Procedures   Medications  cefTRIAXone (ROCEPHIN) injection 250 mg (250 mg Intramuscular Given 01/20/13 2358)  azithromycin (ZITHROMAX) tablet 1,000 mg (1,000 mg Oral Given 01/20/13 2358)  metroNIDAZOLE (FLAGYL) tablet 2,000 mg (2,000 mg Oral Given 01/20/13 2350)  ketorolac (TORADOL) 30 MG/ML injection 30 mg (30 mg Intramuscular Given 01/20/13 2358)      DIAGNOSTIC STUDIES: Oxygen  Saturation is 100% on RA, normal by my interpretation.    COORDINATION OF CARE: 11:11 PM- Pt advised of plan for treatment including Abx for STI's for a period of 10 days if pain does not subside after 1 week pt is to come back for follow up and pt agrees.   Results for orders placed during the hospital encounter of 01/20/13  URINALYSIS, ROUTINE W REFLEX MICROSCOPIC      Result Value Range   Color, Urine YELLOW  YELLOW   APPearance CLOUDY (*)  CLEAR   Specific Gravity, Urine 1.027  1.005 - 1.030   pH 6.0  5.0 - 8.0   Glucose, UA NEGATIVE  NEGATIVE mg/dL   Hgb urine dipstick TRACE (*) NEGATIVE   Bilirubin Urine NEGATIVE  NEGATIVE   Ketones, ur 15 (*) NEGATIVE mg/dL   Protein, ur 30 (*) NEGATIVE mg/dL   Urobilinogen, UA 0.2  0.0 - 1.0 mg/dL   Nitrite NEGATIVE  NEGATIVE   Leukocytes, UA MODERATE (*) NEGATIVE  URINE MICROSCOPIC-ADD ON      Result Value Range   Squamous Epithelial / LPF RARE  RARE   WBC, UA 21-50  <3 WBC/hpf   Bacteria, UA MANY (*) RARE        MDM   1. UTI (urinary tract infection)      Patient seen and evaluated. He appears well. Denies any symptoms for STD. No other clinical signs concerning for STD. However given age and risk factors will treat for possible STD.    I personally performed the services described in this documentation, which was scribed in my presence. The recorded information has been reviewed and is accurate.   Angus Seller, PA-C 01/21/13 [phone]

## 2013-01-20 NOTE — ED Notes (Addendum)
Pt stated he started having muscle spasms in his back today.  Pt denies any heavy lifting or previous hx of back pain. Pt also denies any GU complaints

## 2013-01-21 ENCOUNTER — Encounter (HOSPITAL_COMMUNITY): Payer: Self-pay | Admitting: Emergency Medicine

## 2013-01-21 NOTE — ED Provider Notes (Signed)
Medical screening examination/treatment/procedure(s) were performed by non-physician practitioner and as supervising physician I was immediately available for consultation/collaboration.  Lyanne Co, MD 01/21/13 [phone]

## 2013-01-22 LAB — URINE CULTURE

## 2013-02-20 ENCOUNTER — Emergency Department (HOSPITAL_COMMUNITY)
Admission: EM | Admit: 2013-02-20 | Discharge: 2013-02-20 | Disposition: A | Discharge status: Home / Self Care | Payer: Self-pay | Attending: Emergency Medicine | Admitting: Emergency Medicine

## 2013-02-20 ENCOUNTER — Encounter (HOSPITAL_COMMUNITY): Payer: Self-pay | Admitting: Emergency Medicine

## 2013-02-20 DIAGNOSIS — M79609 Pain in unspecified limb: Secondary | ICD-10-CM | POA: Insufficient documentation

## 2013-02-20 DIAGNOSIS — J45909 Unspecified asthma, uncomplicated: Secondary | ICD-10-CM | POA: Insufficient documentation

## 2013-02-20 DIAGNOSIS — Z760 Encounter for issue of repeat prescription: Secondary | ICD-10-CM | POA: Insufficient documentation

## 2013-02-20 DIAGNOSIS — M79604 Pain in right leg: Secondary | ICD-10-CM

## 2013-02-20 DIAGNOSIS — F17200 Nicotine dependence, unspecified, uncomplicated: Secondary | ICD-10-CM | POA: Insufficient documentation

## 2013-02-20 MED ORDER — METHOCARBAMOL 500 MG PO TABS: 500.0000 mg | ORAL_TABLET | Freq: Two times a day (BID) | ORAL | Status: DC | PRN

## 2013-02-20 MED ORDER — OXYCODONE HCL 5 MG PO TABS: 5.0000 mg | ORAL_TABLET | ORAL | Status: DC | PRN

## 2013-02-20 MED ORDER — OXYCODONE HCL 5 MG PO CAPS: 5.0000 mg | ORAL_CAPSULE | ORAL | Status: DC | PRN
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
## 2013-02-20 NOTE — ED Provider Notes (Signed)
Medical screening examination/treatment/procedure(s) were performed by non-physician practitioner and as supervising physician I was immediately available for consultation/collaboration.   David Masneri, MD 02/20/13 2144 
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
## 2013-02-24 ENCOUNTER — Encounter (HOSPITAL_COMMUNITY): Payer: Self-pay | Admitting: Emergency Medicine

## 2013-02-24 ENCOUNTER — Emergency Department (HOSPITAL_COMMUNITY)
Admission: EM | Admit: 2013-02-24 | Discharge: 2013-02-24 | Disposition: A | Discharge status: Home / Self Care | Payer: Self-pay | Attending: Emergency Medicine | Admitting: Emergency Medicine

## 2013-02-24 DIAGNOSIS — Z888 Allergy status to other drugs, medicaments and biological substances status: Secondary | ICD-10-CM | POA: Insufficient documentation

## 2013-02-24 DIAGNOSIS — Y92410 Unspecified street and highway as the place of occurrence of the external cause: Secondary | ICD-10-CM | POA: Insufficient documentation

## 2013-02-24 DIAGNOSIS — J45909 Unspecified asthma, uncomplicated: Secondary | ICD-10-CM | POA: Insufficient documentation

## 2013-02-24 DIAGNOSIS — M79609 Pain in unspecified limb: Secondary | ICD-10-CM | POA: Insufficient documentation

## 2013-02-24 DIAGNOSIS — Y939 Activity, unspecified: Secondary | ICD-10-CM | POA: Insufficient documentation

## 2013-02-24 DIAGNOSIS — F17200 Nicotine dependence, unspecified, uncomplicated: Secondary | ICD-10-CM | POA: Insufficient documentation

## 2013-02-24 DIAGNOSIS — IMO0002 Reserved for concepts with insufficient information to code with codable children: Secondary | ICD-10-CM | POA: Insufficient documentation

## 2013-02-24 MED ORDER — OXYCODONE HCL 5 MG PO TABS: 5.0000 mg | ORAL_TABLET | Freq: Four times a day (QID) | ORAL | Status: DC | PRN
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
## 2013-02-25 NOTE — ED Provider Notes (Signed)
Medical screening examination/treatment/procedure(s) were performed by non-physician practitioner and as supervising physician I was immediately available for consultation/collaboration.   Laray Anger, DO 02/25/13 2202

## 2013-03-12 ENCOUNTER — Emergency Department (HOSPITAL_COMMUNITY)
Admission: EM | Admit: 2013-03-12 | Discharge: 2013-03-12 | Disposition: A | Discharge status: Home / Self Care | Payer: Self-pay | Attending: Emergency Medicine | Admitting: Emergency Medicine

## 2013-03-12 ENCOUNTER — Encounter (HOSPITAL_COMMUNITY): Payer: Self-pay | Admitting: Emergency Medicine

## 2013-03-12 DIAGNOSIS — M79604 Pain in right leg: Secondary | ICD-10-CM

## 2013-03-12 DIAGNOSIS — G8921 Chronic pain due to trauma: Secondary | ICD-10-CM | POA: Insufficient documentation

## 2013-03-12 DIAGNOSIS — F17200 Nicotine dependence, unspecified, uncomplicated: Secondary | ICD-10-CM | POA: Insufficient documentation

## 2013-03-12 DIAGNOSIS — J45909 Unspecified asthma, uncomplicated: Secondary | ICD-10-CM | POA: Insufficient documentation

## 2013-03-12 DIAGNOSIS — M79609 Pain in unspecified limb: Secondary | ICD-10-CM | POA: Insufficient documentation

## 2013-03-12 MED ORDER — OXYCODONE HCL 5 MG PO TABS
5.0000 mg | ORAL_TABLET | Freq: Once | ORAL | Status: AC
  Administered 2013-03-12: 5 mg via ORAL
  Filled 2013-03-12: qty 1
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
## 2013-03-14 NOTE — ED Provider Notes (Signed)
I saw and evaluated the patient, reviewed the resident's note and I agree with the findings and plan.  EKG Interpretation   None      Patient with chronic pain. Requesting another medicine refill. Has been here for same. He was not given a refill.   Juliet Rude. Rubin Payor, MD 03/14/13 1517

## 2013-03-24 ENCOUNTER — Emergency Department (HOSPITAL_COMMUNITY): Payer: Self-pay

## 2013-03-24 ENCOUNTER — Encounter (HOSPITAL_COMMUNITY): Payer: Self-pay | Admitting: Emergency Medicine

## 2013-03-24 ENCOUNTER — Emergency Department (HOSPITAL_COMMUNITY)
Admission: EM | Admit: 2013-03-24 | Discharge: 2013-03-24 | Disposition: A | Discharge status: Home / Self Care | Payer: Self-pay | Attending: Emergency Medicine | Admitting: Emergency Medicine

## 2013-03-24 DIAGNOSIS — R059 Cough, unspecified: Secondary | ICD-10-CM | POA: Insufficient documentation

## 2013-03-24 DIAGNOSIS — J029 Acute pharyngitis, unspecified: Secondary | ICD-10-CM | POA: Insufficient documentation

## 2013-03-24 DIAGNOSIS — J069 Acute upper respiratory infection, unspecified: Secondary | ICD-10-CM | POA: Insufficient documentation

## 2013-03-24 DIAGNOSIS — R05 Cough: Secondary | ICD-10-CM | POA: Insufficient documentation

## 2013-03-24 DIAGNOSIS — F17200 Nicotine dependence, unspecified, uncomplicated: Secondary | ICD-10-CM | POA: Insufficient documentation

## 2013-03-24 DIAGNOSIS — J45909 Unspecified asthma, uncomplicated: Secondary | ICD-10-CM | POA: Insufficient documentation

## 2013-03-24 DIAGNOSIS — R0602 Shortness of breath: Secondary | ICD-10-CM | POA: Insufficient documentation

## 2013-03-24 MED ORDER — ALBUTEROL SULFATE (5 MG/ML) 0.5% IN NEBU
5.0000 mg | INHALATION_SOLUTION | Freq: Once | RESPIRATORY_TRACT | Status: AC
  Administered 2013-03-24: 5 mg via RESPIRATORY_TRACT
  Filled 2013-03-24: qty 1

## 2013-03-24 MED ORDER — PREDNISONE 20 MG PO TABS: 60.0000 mg | ORAL_TABLET | Freq: Every day | ORAL | Status: DC

## 2013-03-24 MED ORDER — IPRATROPIUM BROMIDE 0.02 % IN SOLN
0.5000 mg | Freq: Once | RESPIRATORY_TRACT | Status: AC
  Administered 2013-03-24: 0.5 mg via RESPIRATORY_TRACT
  Filled 2013-03-24: qty 2.5

## 2013-03-24 MED ORDER — PREDNISONE 20 MG PO TABS
60.0000 mg | ORAL_TABLET | Freq: Once | ORAL | Status: AC
  Administered 2013-03-24: 60 mg via ORAL
  Filled 2013-03-24: qty 3

## 2013-03-24 MED ORDER — ALBUTEROL SULFATE HFA 108 (90 BASE) MCG/ACT IN AERS: 1.0000 | INHALATION_SPRAY | Freq: Four times a day (QID) | RESPIRATORY_TRACT | Status: AC | PRN

## 2013-03-24 NOTE — ED Provider Notes (Signed)
CSN: [id number]     Arrival date & time 03/24/13  1409 History  This chart was scribed for non-physician practitioner working with Dagmar Hait, MD by Ashley Jacobs, ED scribe. This patient was seen in room WTR9/WTR9 and the patient's care was started at 3:46 PM.   First MD Initiated Contact with Patient 03/24/13 1449     Chief Complaint  Patient presents with  . Chest Pain    chest congestion x 3 days  . Shortness of Breath  . Sore Throat   (Consider location/radiation/quality/duration/timing/severity/associated sxs/prior Treatment) The history is provided by the patient and medical records.   HPI Comments: Mark Cruz is a 37 y.o. male who presents to the Emergency Department complaining of productive cough, SOB, and sore throat for the past three days. Pt has tried Alka-Seizer Cold medication today with no improvements. He also complains of  wheezing, nasal congestion, and chest congestion.   Pt denies fever or chills.  . He has allergies to Tylenol, Ibuprofen, fish and Tramadol. Pt currently smokes 0.5 packs of cigarettes a day. He has a hx of asthma, but denies having an inhaler at this time.  Past Medical History  Diagnosis Date  . Asthma    Past Surgical History  Procedure Laterality Date  . Orchectomy Left    Family History  Problem Relation Age of Onset  . Hypertension Mother   . Hypertension Father    History  Substance Use Topics  . Smoking status: Current Every Day Smoker -- 0.50 packs/day for 5 years    Types: Cigarettes  . Smokeless tobacco: Never Used  . Alcohol Use: Yes     Comment: socially    Review of Systems  HENT: Positive for congestion (chest and nasal) and sore throat.   Respiratory: Positive for cough, shortness of breath and wheezing.   All other systems reviewed and are negative.    Allergies  Fish allergy; Tramadol; Tylenol; and Ibuprofen  Home Medications  No current outpatient prescriptions on file. BP 138/90   Pulse 70  Temp(Src) 98.6 F (37 C) (Oral)  Resp 20  Wt 230 lb (104.327 kg)  SpO2 96% Physical Exam  Nursing note and vitals reviewed. Constitutional: He is oriented to person, place, and time. He appears well-developed and well-nourished. No distress.  HENT:  Head: Normocephalic and atraumatic.  Mouth/Throat: No oropharyngeal exudate.  Mild erythema of the oropharynx No edema of the oropharynx No exudates in the oropharynx No tenderness to palpation of the maxillary or frontal sinuses.   Eyes: EOM are normal. Pupils are equal, round, and reactive to light.  Neck: Normal range of motion. Neck supple. No tracheal deviation present.  Cardiovascular: Normal rate, regular rhythm and normal heart sounds.   Pulmonary/Chest: Effort normal. No respiratory distress. He has wheezes. He has no rales.  Diffuse inspiratory and expiratory wheezing.   Abdominal: Soft. He exhibits no distension.  Musculoskeletal: Normal range of motion.  Neurological: He is alert and oriented to person, place, and time.  Skin: Skin is warm and dry.  Psychiatric: He has a normal mood and affect. His behavior is normal.    ED Course  Procedures (including critical care time) DIAGNOSTIC STUDIES: Oxygen Saturation is 96% on room air, normal by my interpretation.    COORDINATION OF CARE: 3:50 PM Discussed course of care with pt which includes chest x-ray and a 5-day course of Prednisone. Pt understands and agrees.  Labs Review Labs Reviewed - No data to display Imaging Review Dg  Chest 2 View  03/24/2013   CLINICAL DATA:  Dyspnea and chest pain, history of tobacco use.  EXAM: CHEST  2 VIEW  COMPARISON:  Thoracic spine series of May 22, 2006  FINDINGS: The lungs are well-expanded. There is no focal infiltrate. The cardiac silhouette is normal in size. The pulmonary vascularity is not engorged. There is no pleural effusion. There is no evidence of a pneumothorax. The bony thorax also appears normal. .   IMPRESSION: There is no evidence of pneumonia nor CHF. I cannot exclude acute bronchitis with minimal perihilar subsegmental atelectasis in the appropriate clinical setting.   Electronically Signed   By: David  [place]   On: 03/24/2013 15:33    EKG Interpretation   None       MDM  No diagnosis found. Pt CXR negative for acute infiltrate. Patients symptoms are consistent with URI, likely viral etiology. Discussed that antibiotics are not indicated for viral infections. Patient with history of Asthma and wheezing on exam.  Will discharge with Albuterol inhaler and 5 day course of Prednisone.  Pt verbalizes understanding and is agreeable with plan. Pt is hemodynamically stable & in NAD prior to dc.  I personally performed the services described in this documentation, which was scribed in my presence. The recorded information has been reviewed and is accurate.      Santiago Glad, PA-C 03/24/13 [phone]

## 2013-03-24 NOTE — ED Notes (Signed)
Pt reports chest congestion, productive cough, sore throat x 3 days. Sx unresponsive to OTC med Slight inspiratory wheeze noted, posterior

## 2013-03-24 NOTE — Progress Notes (Signed)
Patient confirma he does not have a pcp or insurance.  [facility] provided patient with a list of pcps who accept self pay patients, list of discount pharmacies and website needymeds.org for medication assistance, information regarding Medicaid and Affordable Care Act for insurance, financial assitance in the community such as [employer] Public librarian churches, crisis intervention information, local shelters and dental assistance for patients without insurance.  Patient thankful for resources.

## 2013-03-25 NOTE — ED Provider Notes (Signed)
Medical screening examination/treatment/procedure(s) were performed by non-physician practitioner and as supervising physician I was immediately available for consultation/collaboration.  EKG Interpretation   None         William Milli Woolridge, MD 03/25/13 1508 

## 2013-04-13 ENCOUNTER — Emergency Department (HOSPITAL_COMMUNITY)
Admission: EM | Admit: 2013-04-13 | Discharge: 2013-04-13 | Disposition: A | Discharge status: Home / Self Care | Payer: Self-pay | Attending: Emergency Medicine | Admitting: Emergency Medicine

## 2013-04-13 ENCOUNTER — Encounter (HOSPITAL_COMMUNITY): Payer: Self-pay | Admitting: Emergency Medicine

## 2013-04-13 DIAGNOSIS — J069 Acute upper respiratory infection, unspecified: Secondary | ICD-10-CM | POA: Insufficient documentation

## 2013-04-13 DIAGNOSIS — Z79899 Other long term (current) drug therapy: Secondary | ICD-10-CM | POA: Insufficient documentation

## 2013-04-13 DIAGNOSIS — IMO0002 Reserved for concepts with insufficient information to code with codable children: Secondary | ICD-10-CM | POA: Insufficient documentation

## 2013-04-13 DIAGNOSIS — J45901 Unspecified asthma with (acute) exacerbation: Secondary | ICD-10-CM | POA: Insufficient documentation

## 2013-04-13 DIAGNOSIS — B9789 Other viral agents as the cause of diseases classified elsewhere: Secondary | ICD-10-CM

## 2013-04-13 DIAGNOSIS — F17200 Nicotine dependence, unspecified, uncomplicated: Secondary | ICD-10-CM | POA: Insufficient documentation

## 2013-04-13 DIAGNOSIS — J45909 Unspecified asthma, uncomplicated: Secondary | ICD-10-CM

## 2013-04-13 MED ORDER — HYDROCOD POLST-CHLORPHEN POLST 10-8 MG/5ML PO LQCR: 5.0000 mL | Freq: Two times a day (BID) | ORAL | Status: DC | PRN

## 2013-04-13 MED ORDER — IPRATROPIUM BROMIDE 0.02 % IN SOLN
0.5000 mg | Freq: Once | RESPIRATORY_TRACT | Status: AC
  Administered 2013-04-13: 0.5 mg via RESPIRATORY_TRACT
  Filled 2013-04-13: qty 2.5

## 2013-04-13 MED ORDER — PREDNISONE (PAK) 10 MG PO TABS: ORAL_TABLET | Freq: Every day | ORAL | Status: DC

## 2013-04-13 MED ORDER — ALBUTEROL SULFATE (5 MG/ML) 0.5% IN NEBU
5.0000 mg | INHALATION_SOLUTION | Freq: Once | RESPIRATORY_TRACT | Status: AC
  Administered 2013-04-13: 5 mg via RESPIRATORY_TRACT
  Filled 2013-04-13: qty 1

## 2013-04-13 NOTE — ED Provider Notes (Signed)
CSN: [id number]     Arrival date & time 04/13/13  4098 History   First MD Initiated Contact with Patient 04/13/13 [phone]     Chief Complaint  Patient presents with  . Cough   (Consider location/radiation/quality/duration/timing/severity/associated sxs/prior Treatment) HPI Patient with hx asthma presents with body ache, cough, wheezing, sore throat, nasal congestion ,mild SOB, mild chest tightness that began last night.  Used his inhaler last night with some relief.  Denies fevers.  Denies known sick contacts.    Past Medical History  Diagnosis Date  . Asthma    Past Surgical History  Procedure Laterality Date  . Orchectomy Left    Family History  Problem Relation Age of Onset  . Hypertension Mother   . Hypertension Father    History  Substance Use Topics  . Smoking status: Current Every Day Smoker -- 0.50 packs/day for 5 years    Types: Cigarettes  . Smokeless tobacco: Never Used  . Alcohol Use: Yes     Comment: socially    Review of Systems  Constitutional: Negative for fever and chills.  HENT: Positive for congestion and sore throat. Negative for trouble swallowing.   Respiratory: Positive for cough, chest tightness and shortness of breath.   Cardiovascular: Negative for chest pain, palpitations and leg swelling.  Gastrointestinal: Negative for nausea, vomiting, abdominal pain and diarrhea.  Genitourinary: Negative for dysuria, urgency and frequency.    Allergies  Fish allergy; Tramadol; Tylenol; and Ibuprofen  Home Medications   Current Outpatient Rx  Name  Route  Sig  Dispense  Refill  . albuterol (PROVENTIL HFA;VENTOLIN HFA) 108 (90 BASE) MCG/ACT inhaler   Inhalation   Inhale 1-2 puffs into the lungs every 6 (six) hours as needed for wheezing or shortness of breath.   1 Inhaler   0   . predniSONE (DELTASONE) 20 MG tablet   Oral   Take 3 tablets (60 mg total) by mouth daily.   12 tablet   0    BP 136/66  Pulse 66  Temp(Src) 98.5 F (36.9 C) (Oral)   Resp 20  SpO2 100% Physical Exam  Nursing note and vitals reviewed. Constitutional: He appears well-developed and well-nourished. No distress.  HENT:  Head: Normocephalic and atraumatic.  Mouth/Throat: Oropharynx is clear and moist. No oropharyngeal exudate.  Eyes: Conjunctivae are normal.  Neck: Normal range of motion. Neck supple.  Cardiovascular: Normal rate and regular rhythm.   Pulmonary/Chest: Effort normal. No accessory muscle usage or stridor. Not tachypneic. No respiratory distress. He has no decreased breath sounds. He has wheezes. He has no rhonchi. He has no rales.  Slight end expiratory wheeze  Lymphadenopathy:    He has no cervical adenopathy.  Neurological: He is alert. He exhibits normal muscle tone.  Skin: He is not diaphoretic.    ED Course  Procedures (including critical care time) Labs Review Labs Reviewed - No data to display Imaging Review No results found.  EKG Interpretation   None      Pt reexamined after neb treatment, lungs CTAB  MDM   1. Viral respiratory illness   2. Asthma    Pt with hx asthma and smoking p/w one day respiratory symptoms.  Slight wheeze resolved with albuterol neb.  No rales or ronchi, O2 100% on room air, doubt pneumonia.  D/C home with prednisone, tussionex, continue home albuterol hfa.  Discussed result, findings, treatment, and follow up  with patient.  Pt given return precautions.  Pt verbalizes understanding and agrees with plan.  I doubt any other EMC precluding discharge at this time including, but not necessarily limited to the following: CAP, PE    Trixie Dredge, PA-C 04/13/13 [phone]

## 2013-04-13 NOTE — ED Provider Notes (Signed)
Medical screening examination/treatment/procedure(s) were performed by non-physician practitioner and as supervising physician I was immediately available for consultation/collaboration.  EKG Interpretation   None        Donnetta Hutching, MD 04/13/13 1145

## 2013-04-13 NOTE — ED Notes (Signed)
Patient with history of asthma with wheezing, cough, runny nose, and body aches.  Denies fever, nausea, vomiting.  Has been using his albuterol inhaler.  Coughing up yellowish sputum.   Patient smokes 1/2 ppd.

## 2013-04-13 NOTE — Progress Notes (Signed)
[facility] provided pt with a list of primary care resources and a [facility] Orange Card application.  °

## 2013-06-04 ENCOUNTER — Emergency Department (HOSPITAL_COMMUNITY)
Admission: EM | Admit: 2013-06-04 | Discharge: 2013-06-04 | Disposition: A | Discharge status: Home / Self Care | Payer: Self-pay | Attending: Emergency Medicine | Admitting: Emergency Medicine

## 2013-06-04 ENCOUNTER — Encounter (HOSPITAL_COMMUNITY): Payer: Self-pay | Admitting: Emergency Medicine

## 2013-06-04 ENCOUNTER — Emergency Department (HOSPITAL_COMMUNITY): Payer: Self-pay

## 2013-06-04 DIAGNOSIS — N390 Urinary tract infection, site not specified: Secondary | ICD-10-CM | POA: Insufficient documentation

## 2013-06-04 DIAGNOSIS — F17200 Nicotine dependence, unspecified, uncomplicated: Secondary | ICD-10-CM | POA: Insufficient documentation

## 2013-06-04 DIAGNOSIS — Z79899 Other long term (current) drug therapy: Secondary | ICD-10-CM | POA: Insufficient documentation

## 2013-06-04 DIAGNOSIS — Z87442 Personal history of urinary calculi: Secondary | ICD-10-CM | POA: Insufficient documentation

## 2013-06-04 DIAGNOSIS — J45909 Unspecified asthma, uncomplicated: Secondary | ICD-10-CM | POA: Insufficient documentation

## 2013-06-04 LAB — COMPREHENSIVE METABOLIC PANEL
ALBUMIN: 3.3 g/dL — AB (ref 3.5–5.2)
ALK PHOS: 89 U/L (ref 39–117)
ALT: 19 U/L (ref 0–53)
AST: 18 U/L (ref 0–37)
BILIRUBIN TOTAL: 0.2 mg/dL — AB (ref 0.3–1.2)
BUN: 15 mg/dL (ref 6–23)
CHLORIDE: 100 meq/L (ref 96–112)
CO2: 25 mEq/L (ref 19–32)
Calcium: 8.3 mg/dL — ABNORMAL LOW (ref 8.4–10.5)
Creatinine, Ser: 1.3 mg/dL (ref 0.50–1.35)
GFR calc Af Amer: 80 mL/min — ABNORMAL LOW (ref >90)
GFR calc non Af Amer: 69 mL/min — ABNORMAL LOW (ref >90)
Glucose, Bld: 129 mg/dL — ABNORMAL HIGH (ref 70–99)
POTASSIUM: 4 meq/L (ref 3.7–5.3)
SODIUM: 137 meq/L (ref 137–147)
TOTAL PROTEIN: 6.7 g/dL (ref 6.0–8.3)

## 2013-06-04 LAB — CBC WITH DIFFERENTIAL/PLATELET
BASOS ABS: 0 10*3/uL (ref 0.0–0.1)
BASOS PCT: 0 % (ref 0–1)
EOS ABS: 0.4 10*3/uL (ref 0.0–0.7)
Eosinophils Relative: 4 % (ref 0–5)
HCT: 44.2 % (ref 39.0–52.0)
HEMOGLOBIN: 14.7 g/dL (ref 13.0–17.0)
Lymphocytes Relative: 32 % (ref 12–46)
Lymphs Abs: 2.9 10*3/uL (ref 0.7–4.0)
MCH: 26.8 pg (ref 26.0–34.0)
MCHC: 33.3 g/dL (ref 30.0–36.0)
MCV: 80.5 fL (ref 78.0–100.0)
MONOS PCT: 11 % (ref 3–12)
Monocytes Absolute: 1 10*3/uL (ref 0.1–1.0)
NEUTROS ABS: 4.8 10*3/uL (ref 1.7–7.7)
NEUTROS PCT: 53 % (ref 43–77)
PLATELETS: 251 10*3/uL (ref 150–400)
RBC: 5.49 MIL/uL (ref 4.22–5.81)
RDW: 14 % (ref 11.5–15.5)
WBC: 9 10*3/uL (ref 4.0–10.5)

## 2013-06-04 LAB — URINE MICROSCOPIC-ADD ON

## 2013-06-04 LAB — URINALYSIS, ROUTINE W REFLEX MICROSCOPIC
Bilirubin Urine: NEGATIVE
Glucose, UA: NEGATIVE mg/dL
KETONES UR: NEGATIVE mg/dL
NITRITE: NEGATIVE
PH: 5.5 (ref 5.0–8.0)
Protein, ur: NEGATIVE mg/dL
SPECIFIC GRAVITY, URINE: 1.026 (ref 1.005–1.030)
UROBILINOGEN UA: 0.2 mg/dL (ref 0.0–1.0)

## 2013-06-04 MED ORDER — PROMETHAZINE HCL 25 MG PO TABS: 25.0000 mg | ORAL_TABLET | Freq: Four times a day (QID) | ORAL | Status: DC | PRN

## 2013-06-04 MED ORDER — SULFAMETHOXAZOLE-TRIMETHOPRIM 800-160 MG PO TABS: 1.0000 | ORAL_TABLET | Freq: Two times a day (BID) | ORAL | Status: DC

## 2013-06-04 MED ORDER — ONDANSETRON HCL 4 MG/2ML IJ SOLN
4.0000 mg | Freq: Once | INTRAMUSCULAR | Status: AC
  Administered 2013-06-04: 4 mg via INTRAVENOUS
  Filled 2013-06-04: qty 2

## 2013-06-04 MED ORDER — KETOROLAC TROMETHAMINE 30 MG/ML IJ SOLN
30.0000 mg | Freq: Once | INTRAMUSCULAR | Status: AC
  Administered 2013-06-04: 30 mg via INTRAVENOUS
  Filled 2013-06-04: qty 1

## 2013-06-04 MED ORDER — SODIUM CHLORIDE 0.9 % IV BOLUS (SEPSIS)
1000.0000 mL | Freq: Once | INTRAVENOUS | Status: AC
  Administered 2013-06-04: 1000 mL via INTRAVENOUS

## 2013-06-04 MED ORDER — DIPHENHYDRAMINE HCL 50 MG/ML IJ SOLN
12.5000 mg | Freq: Once | INTRAMUSCULAR | Status: AC
  Administered 2013-06-04: 12.5 mg via INTRAVENOUS
  Filled 2013-06-04: qty 1

## 2013-06-04 NOTE — ED Notes (Signed)
Pt reports similar pain in past with past diagnoses of UTI and kidney stone Sep/Nov 2014. Pt denies odor or blood in urine. Pt given urinal and encouraged to void.

## 2013-06-04 NOTE — ED Notes (Signed)
Right flank pain since 8pm; worse thru night; frequent urination

## 2013-06-04 NOTE — Discharge Instructions (Signed)
Urinary Tract Infection °Urinary tract infections (UTIs) can develop anywhere along your urinary tract. Your urinary tract is your body's drainage system for removing wastes and extra water. Your urinary tract includes two kidneys, two ureters, a bladder, and a urethra. Your kidneys are a pair of bean-shaped organs. Each kidney is about the size of your fist. They are located below your ribs, one on each side of your spine. °CAUSES °Infections are caused by microbes, which are microscopic organisms, including fungi, viruses, and bacteria. These organisms are so small that they can only be seen through a microscope. Bacteria are the microbes that most commonly cause UTIs. °SYMPTOMS  °Symptoms of UTIs may vary by age and gender of the patient and by the location of the infection. Symptoms in young women typically include a frequent and intense urge to urinate and a painful, burning feeling in the bladder or urethra during urination. Older women and men are more likely to be tired, shaky, and weak and have muscle aches and abdominal pain. A fever may mean the infection is in your kidneys. Other symptoms of a kidney infection include pain in your back or sides below the ribs, nausea, and vomiting. °DIAGNOSIS °To diagnose a UTI, your caregiver will ask you about your symptoms. Your caregiver also will ask to provide a urine sample. The urine sample will be tested for bacteria and white blood cells. White blood cells are made by your body to help fight infection. °TREATMENT  °Typically, UTIs can be treated with medication. Because most UTIs are caused by a bacterial infection, they usually can be treated with the use of antibiotics. The choice of antibiotic and length of treatment depend on your symptoms and the type of bacteria causing your infection. °HOME CARE INSTRUCTIONS °· If you were prescribed antibiotics, take them exactly as your caregiver instructs you. Finish the medication even if you feel better after you  have only taken some of the medication. °· Drink enough water and fluids to keep your urine clear or pale yellow. °· Avoid caffeine, tea, and carbonated beverages. They tend to irritate your bladder. °· Empty your bladder often. Avoid holding urine for long periods of time. °· Empty your bladder before and after sexual intercourse. °· After a bowel movement, women should cleanse from front to back. Use each tissue only once. °SEEK MEDICAL CARE IF:  °· You have back pain. °· You develop a fever. °· Your symptoms do not begin to resolve within 3 days. °SEEK IMMEDIATE MEDICAL CARE IF:  °· You have severe back pain or lower abdominal pain. °· You develop chills. °· You have nausea or vomiting. °· You have continued burning or discomfort with urination. °MAKE SURE YOU:  °· Understand these instructions. °· Will watch your condition. °· Will get help right away if you are not doing well or get worse. °Document Released: 02/07/2005 Document Revised: 10/30/2011 Document Reviewed: 06/08/2011 °ExitCare® Patient Information ©2014 [employer]. ° °Emergency Department Resource Guide °1) Find a Doctor and Pay Out of Pocket °Although you won't have to find out who is covered by your insurance plan, it is a good idea to ask around and get recommendations. You will then need to call the office and see if the doctor you have chosen will accept you as a new patient and what types of options they offer for patients who are self-pay. Some doctors offer discounts or will set up payment plans for their patients who do not have insurance, but you will need   to ask so you aren't surprised when you get to your appointment. ° °2) Contact Your Local Health Department °Not all health departments have doctors that can see patients for sick visits, but many do, so it is worth a call to see if yours does. If you don't know where your local health department is, you can check in your phone book. The CDC also has a tool to help you locate your  state's health department, and many state websites also have listings of all of their local health departments. ° °3) Find a Walk-in Clinic °If your illness is not likely to be very severe or complicated, you may want to try a walk in clinic. These are popping up all over the country in pharmacies, drugstores, and shopping centers. They're usually staffed by nurse practitioners or physician assistants that have been trained to treat common illnesses and complaints. They're usually fairly quick and inexpensive. However, if you have serious medical issues or chronic medical problems, these are probably not your best option. ° °No Primary Care Doctor: °- Call Health Connect at  832-8000 - they can help you locate a primary care doctor that  accepts your insurance, provides certain services, etc. °- Physician Referral Service- 1-800-533-3463 ° °Chronic Pain Problems: °Organization         Address  Phone   Notes  °[facility]  (336) 297-2271 Patients need to be referred by their primary care doctor.  ° °Medication Assistance: °Organization         Address  Phone   Notes  °[place] [facility] [address] °[place], [place] [address] (336) 641-8030 --Must be a resident of [place] °-- Must have NO insurance coverage whatsoever (no Medicaid/ Medicare, etc.) °-- The pt. MUST have a primary care doctor that directs their care regularly and follows them in the community °  °[facility]  (866) 331-1348   °[employer]  (888) 892-1162   ° °Agencies that provide inexpensive medical care: °Organization         Address  Phone   Notes  °[facility]  (336) 832-8035   °[facility]    (336) 832-7272   °[facility] [address] °[place], [place] [address] (336) 832-4777   °[facility] [address], °[place] (336) 271-4999   °[facility]    (336) 373-0678   °[facility]    (336)  272-1050   °[facility] ° [address], [place] Phone:  (336) 832-4444, Fax:  (336) 832-4440 Hours of Operation:  9 am - 6 pm, M-F.  Also accepts Medicaid/Medicare and self-pay.  °[facility] ° [address], [place] Phone: (336) 832-3150, Fax: (336) 832-3151. Hours of Operation:  8:30 am - 5:30 pm, M-F.  Also accepts Medicaid and self-pay.  °[facility] [place] [address], [place] Phone: (336) 878-6027   °[facility] [address], [place], [place] (336)723-1848, Ext. 123 Mondays & Thursdays: 7-9 AM.  First 15 patients are seen on a first come, first serve basis. °  ° °Medicaid-accepting [place] Providers: ° °Organization         Address  Phone   Notes  °[facility] [address], [place] (336) 641-2100 Also accepts self-pay patients.  °[facility] [address], [place] ° (  336) 856-9996   °[facility] [address] [place] [address], [place] (336) 288-8857   °[facility] [address] [place] [address], [place] (336) 299-7000   °Veita Bland [address], [place]  ° (336) 373-1557 Only accepts  Access Medicaid patients after they have their name applied to their card.  ° °Self-Pay (no insurance) in [place]: ° °Organization         Address  Phone   Notes  °Sickle Cell Patients, [facility] [address], [place] (336) 832-1970   °[facility] [address], [place] (336) 832-4400   °[facility] [place] ° [address], [place] (336) 992-4800   °[facility]/Dr. Osei-Bonsu ° [address] [place] [address], [place] or [address], [place] (336) 841-8500 Phone number for both [place] and [place] locations is the same.  °[facility] [address], [place] (336)  299-0000   °[facility] [address] [place] [address], [place] or [address] (336) 852-7530 °(336) 878-2260   °[facility] [address], [place] (336) 350-1642, phone; (336) 294-5005, fax Sees patients 1st and 3rd Saturday of every month.  Must not qualify for public or private insurance (i.e. Medicaid, Medicare, [place] Health Choice, Veterans' Benefits) • Household income should be no more than 200% of the poverty level •The clinic cannot treat you if you are pregnant or think you are pregnant • Sexually transmitted diseases are not treated at the clinic.  ° ° °Dental Care: °Organization         Address  Phone  Notes  °[facility] [address], [place] (336) 641-6152 Accepts children up to age 21 who are enrolled in Medicaid or [place] Health Choice; pregnant women with a Medicaid card; and children who have applied for Medicaid or [place] Health Choice, but were declined, whose parents can pay a reduced fee at time of service.  °[facility] [place]  [address], [place] (336) 641-7733 Accepts children up to age 21 who are enrolled in Medicaid or [place] Health Choice; pregnant women with a Medicaid card; and children who have applied for Medicaid or [place] Health Choice, but were declined, whose parents can pay a reduced fee at time of service.  °[facility] ° [address],  (336) 641-4533 Patients are seen by appointment only. Walk-ins are not accepted. [facility] will see patients 18 years of age and older. °Monday - Tuesday (8am-5pm) °Most Wednesdays (8:30-5pm) °$30 per visit, cash only  °[facility] ° [address], [place] (336) 641-4533 Patients are seen by appointment only. Walk-ins are not accepted. [facility] will see patients 18 years of age and older. °One Wednesday Evening (Monthly: Volunteer  Based).  $30 per visit, cash only  °[facility]  (919) 537-3737 for adults; Children under age 4, call Graduate Pediatric Dentistry at ([phone]) [phone]. Children aged 4-14, please call (919) [phone] to request a pediatric application. ° Dental services are provided in all areas of dental care including fillings, crowns and bridges, complete and partial dentures, implants, gum treatment, root canals, and extractions. Preventive care is also provided. Treatment is provided to both adults and children. °Patients are selected via a lottery and there is   often a waiting list. °  °[facility] [address], °[place] ° (336) 763-8833 [email or web address] °  °[facility] [address], [place], [place] (336)723-1848, Ext. 123 Second and Fourth Thursday of each month, opens at 6:30 AM; Clinic ends at 9 AM.  Patients are seen on a first-come first-served basis, and a limited number are seen during each clinic.  ° °[facility] ° [address], [place], [place] (336) 723-7904   Eligibility Requirements °You must have lived in [place], [place], or [place] counties for at least the last three months. °  You cannot be eligible for state or federal sponsored healthcare insurance, including Veterans Administration, Medicaid, or Medicare. °  You generally cannot be eligible for healthcare insurance through your employer.  °  How to apply: °Eligibility screenings are held every Tuesday and Wednesday afternoon from 1:00 pm until 4:00 pm. You do not need an appointment for the interview!  °[facility] [address], [place], [place] 336-631-2330   °[facility]  336-342-8273   °[facility]  336-703-3100   °[facility]  336-570-6415   ° °Behavioral Health Resources in the Community: °Intensive Outpatient Programs °Organization         Address  Phone  Notes  °[facility] [address], [place], [place] 336-878-6098   °[facility] [address], [place], [place] 336-832-9800   °[facility] [address], [place], [place] ° 336-882-2125   °[facility] [address],  °[place], [place] 1-800-853-5163 or 336-641-4981   °Substance Abuse Resources °Organization         Address  Phone  Notes  °[facility]  336-882-2125   °[facility]  336-784-9470   °[facility]  336-285-9073   °[facility]  336-845-3988   °Residential & Outpatient Substance Abuse Program  1-800-659-3381   °Psychological Services °Organization         Address  Phone  Notes  °[facility]  336- 832-9600   °[facility]  336- 378-7881   °[facility] [address], [place] 1-800-853-5163 or 336-641-4981   ° °Mobile Crisis Teams °Organization         Address  Phone  Notes  °[facility], Mobile Crisis Care Unit  1-877-626-1772   °Assertive °[facility] ° [address] [place], [place] 336-834-9664   °Sharon DeEsch [address] °[place] 336-554-5454   ° °Self-Help/Support Groups °Organization         Address  Phone             Notes  °[facility] - variety of support groups  336- 373-1402 Call for more information  °Narcotics Anonymous (NA), [facility] [address], °[place]  2 meetings at this location  ° °Residential Treatment Programs °Organization         Address  Phone  Notes  °[facility] [address],    °[place]  1-866-801-8205   °[facility] ° [address], [place], [place] 704-293-8524   °[facility] [address], [place] 336-845-3988 Admissions: 8am-3pm M-F  °[facility] [address]    °[place], [place] 336-841-1104   °[facility] [address], [place], [place] 336-379-7146    °  [facility] [address]  °[place], [place] 336-285-9073   °[facility] - Intensive Outpatient [address], , [place] 336-852-3033   °[facility] [address]  °[place], [place] 1-877-615-2722 or 336-784-9470   °[facility] [address] [place], [place] 336-227-7417 Accepts Medicaid  °[facility] [address]  ° [place] 1-800-659-3381 Substance Abuse/Addiction Treatment  ° °[place] Behavioral Health Resources °Organization         Address  Phone  Notes  °[facility]  (888) 581-9988   °Julie Brannon, PhD [address] [place], [place]   (336) 349-5553 or (336) 951-0000   °[facility]   [address] °[place], [place] (336) 349-4454   °[facility] [address], [place], [place] (336) 342-8316 Insurance/Medicaid/sponsorship through [facility]  °[facility] [address]                                    [place], [place] (336) 342-8316 Therapy/tele-psych/case  °[facility] [address]  ° [place], [place] (336) 349-2233    °Dr. Arfeen  (336) 349-4544   °[facility] [place]  [employer] [place] [facility] 1) [address], [place] °2) [address], [place] °3)  [address], [place] (336) 349-3220 °(336) 342-7768 ° °(336) 342-8140   °[place] [facility] (336) 342-1394 or (336) 342-3537 (After Hours)    ° ° ° °

## 2013-06-04 NOTE — ED Notes (Signed)
Pt ambulated to nearby restroom to void.  

## 2013-06-04 NOTE — ED Provider Notes (Addendum)
TIME SEEN: 8:48 AM  CHIEF COMPLAINT: Right-sided flank pain  HPI: Patient is a 38 year old male with a history of asthma and reported history of prior kidney stones who presents emergency department with right-sided sharp flank pain that radiates into his right lower abdomen that started at 8 PM last night. He reports is progressively worsened. It is not described as moderate to severe. No aggravating or relieving factors. He states it feels different than his prior kidney stone. He has had urinary frequency but no urgency, dysuria or hematuria. No fevers, chills, nausea, vomiting or diarrhea. No penile discharge, testicular pain or swelling. No prior abdominal surgery. No history of injury to the back, no numbness, tingling or focal weakness. No bowel or bladder incontinence.  ROS: See HPI Constitutional: no fever  Eyes: no drainage  ENT: no runny nose   Cardiovascular:  no chest pain  Resp: no SOB  GI: no vomiting GU: no dysuria Integumentary: no rash  Allergy: no hives  Musculoskeletal: no leg swelling  Neurological: no slurred speech ROS otherwise negative  PAST MEDICAL HISTORY/PAST SURGICAL HISTORY:  Past Medical History  Diagnosis Date  . Asthma     MEDICATIONS:  Prior to Admission medications   Medication Sig Start Date End Date Taking? Authorizing Provider  albuterol (PROVENTIL HFA;VENTOLIN HFA) 108 (90 BASE) MCG/ACT inhaler Inhale 1-2 puffs into the lungs every 6 (six) hours as needed for wheezing or shortness of breath. 03/24/13  Yes Santiago Glad, PA-C    ALLERGIES:  Allergies  Allergen Reactions  . Fish Allergy Hives  . Tramadol Swelling  . Tylenol [Acetaminophen] Hives  . Ibuprofen Rash    SOCIAL HISTORY:  History  Substance Use Topics  . Smoking status: Current Every Day Smoker -- 0.50 packs/day for 5 years    Types: Cigarettes  . Smokeless tobacco: Never Used  . Alcohol Use: Yes     Comment: socially    FAMILY HISTORY: Family History  Problem  Relation Age of Onset  . Hypertension Mother   . Hypertension Father     EXAM: BP 118/70  Pulse 65  Temp(Src) 98.5 F (36.9 C)  Resp 20  SpO2 99% CONSTITUTIONAL: Alert and oriented and responds appropriately to questions. Well-appearing; well-nourished HEAD: Normocephalic EYES: Conjunctivae clear, PERRL ENT: normal nose; no rhinorrhea; moist mucous membranes; pharynx without lesions noted NECK: Supple, no meningismus, no LAD  CARD: RRR; S1 and S2 appreciated; no murmurs, no clicks, no rubs, no gallops RESP: Normal chest excursion without splinting or tachypnea; breath sounds clear and equal bilaterally; no wheezes, no rhonchi, no rales,  ABD/GI: Normal bowel sounds; non-distended; soft, mildly tender to palpation diffusely throughout the right abdomen without rebound or guarding or peritoneal signs BACK:  The back appears normal and is non-tender to palpation, right CVA tenderness EXT: Normal ROM in all joints; non-tender to palpation; no edema; normal capillary refill; no cyanosis    SKIN: Normal color for age and race; warm NEURO: Moves all extremities equally PSYCH: The patient's mood and manner are appropriate. Grooming and personal hygiene are appropriate.  MEDICAL DECISION MAKING: Patient here with right flank pain. It is reproducible with palpation of his back but he is mildly tender in his right abdomen. He is reported history of kidney stones.  He has had 3 CT scans of his abdomen and pelvis in our system in 2007 which were normal.  No other study confirming stones.  Patient's symptoms may be due to musculoskeletal pain versus nephrolithiasis versus pyelonephritis. Will obtain abdominal  labs, urine. Will give Toradol, IV fluids and reassess.  ED PROGRESS: Patient's blood and leukocytes in his urine but also squamous cells.  Culture pending.  Possible UTI, pyelonephritis. Otherwise labs are unremarkable.  Will obtain CTAP to evaluate for kidney stone.  12:20 PM  Pt reports  feeling better. CT scan shows no nephrolithiasis. No hydronephrosis. Patient does have urinary bladder wall thickening consistent with cystitis. He also has enlarged lymph nodes in the right lower quadrant that may be due to mesenteric adenitis but his appendix appears normal. Discussed with patient that he appears to have urinary tract infection. We'll discharge home on Bactrim. Culture pending. Patient reports he is sexually active with one partner and does not use condoms. No concern for STI exposure.  No testicular pain. Patient is status post left orchiectomy.  Given patient is well-appearing with no further vomiting, do not feel he has pyelonephritis or needs to be admitted at this time. We'll discharge home with return precautions, antibiotics. Patient verbalizes understanding and is comfortable plan.     Layla MawKristen N Ward, DO 06/04/13 1221  Layla MawKristen N Ward, DO 06/04/13 1222

## 2013-06-04 NOTE — Progress Notes (Signed)
[facility] provided pt with a list of primary care resources, [facility] information, and a [facility] [employer] Card application.  °

## 2013-06-05 LAB — URINE CULTURE
CULTURE: NO GROWTH
Colony Count: NO GROWTH

## 2013-07-18 ENCOUNTER — Encounter (HOSPITAL_COMMUNITY): Payer: Self-pay | Admitting: Emergency Medicine

## 2013-07-18 ENCOUNTER — Emergency Department (HOSPITAL_COMMUNITY)
Admission: EM | Admit: 2013-07-18 | Discharge: 2013-07-19 | Disposition: A | Discharge status: Home / Self Care | Payer: Self-pay | Attending: Emergency Medicine | Admitting: Emergency Medicine

## 2013-07-18 DIAGNOSIS — J40 Bronchitis, not specified as acute or chronic: Secondary | ICD-10-CM

## 2013-07-18 DIAGNOSIS — J45909 Unspecified asthma, uncomplicated: Secondary | ICD-10-CM | POA: Insufficient documentation

## 2013-07-18 DIAGNOSIS — F17200 Nicotine dependence, unspecified, uncomplicated: Secondary | ICD-10-CM | POA: Insufficient documentation

## 2013-07-18 DIAGNOSIS — Z79899 Other long term (current) drug therapy: Secondary | ICD-10-CM | POA: Insufficient documentation

## 2013-07-18 NOTE — ED Notes (Signed)
Pt presents with R sided CP onset 5 days ago, constant 4 days. +n/v, diaphoresis yesterday. + SHOB with dark colored sputum

## 2013-07-19 ENCOUNTER — Emergency Department (HOSPITAL_COMMUNITY): Payer: Self-pay

## 2013-07-19 MED ORDER — METHYLPREDNISOLONE SODIUM SUCC 125 MG IJ SOLR
125.0000 mg | Freq: Once | INTRAMUSCULAR | Status: AC
  Administered 2013-07-19: 125 mg via INTRAVENOUS
  Filled 2013-07-19: qty 2

## 2013-07-19 MED ORDER — PREDNISONE 20 MG PO TABS: 60.0000 mg | ORAL_TABLET | Freq: Every day | ORAL | Status: DC

## 2013-07-19 MED ORDER — BENZONATATE 100 MG PO CAPS
100.0000 mg | ORAL_CAPSULE | Freq: Once | ORAL | Status: AC
  Administered 2013-07-19: 100 mg via ORAL
  Filled 2013-07-19: qty 1

## 2013-07-19 MED ORDER — GUAIFENESIN 100 MG/5ML PO LIQD: 100.0000 mg | ORAL | Status: DC | PRN

## 2013-07-19 MED ORDER — ALBUTEROL SULFATE (2.5 MG/3ML) 0.083% IN NEBU
5.0000 mg | INHALATION_SOLUTION | Freq: Once | RESPIRATORY_TRACT | Status: AC
  Administered 2013-07-19: 5 mg via RESPIRATORY_TRACT
  Filled 2013-07-19: qty 6

## 2013-07-19 NOTE — ED Provider Notes (Signed)
CSN: [id number]     Arrival date & time 07/18/13  2338 History   First MD Initiated Contact with Patient 07/19/13 0017     Chief Complaint  Patient presents with  . Chest Pain     (Consider location/radiation/quality/duration/timing/severity/associated sxs/prior Treatment) HPI History per patient. Is a smoker with history of asthma, developed right-sided chest pain sharp in quality about 5 days ago. persistent with increased cough and difficulty breathing tonight. No known sick contacts. No rash. No trauma. Currently breathing feels improved. No hemoptysis. No leg pain or leg swelling. No history of DVT or PE. Has had some dark productive sputum.  Past Medical History  Diagnosis Date  . Asthma    Past Surgical History  Procedure Laterality Date  . Orchectomy Left    Family History  Problem Relation Age of Onset  . Hypertension Mother   . Hypertension Father    History  Substance Use Topics  . Smoking status: Current Every Day Smoker -- 0.50 packs/day for 5 years    Types: Cigarettes  . Smokeless tobacco: Never Used  . Alcohol Use: Yes     Comment: socially    Review of Systems  Constitutional: Negative for fever and chills.  Respiratory: Positive for cough.   Cardiovascular: Positive for chest pain. Negative for leg swelling.  Gastrointestinal: Negative for abdominal pain.  Genitourinary: Negative for flank pain.  Musculoskeletal: Negative for back pain, neck pain and neck stiffness.  Skin: Negative for rash.  Neurological: Negative for weakness and numbness.  All other systems reviewed and are negative.      Allergies  Fish allergy; Tramadol; Tylenol; and Ibuprofen  Home Medications   Current Outpatient Rx  Name  Route  Sig  Dispense  Refill  . albuterol (PROVENTIL HFA;VENTOLIN HFA) 108 (90 BASE) MCG/ACT inhaler   Inhalation   Inhale 1-2 puffs into the lungs every 6 (six) hours as needed for wheezing or shortness of breath.   1 Inhaler   0    BP 134/85   Pulse 72  Temp(Src) 98.7 F (37.1 C) (Oral)  Resp 17  Ht 6\' 2"  (1.88 m)  Wt 230 lb (104.327 kg)  BMI 29.52 kg/m2  SpO2 97% Physical Exam  Constitutional: He is oriented to person, place, and time. He appears well-developed and well-nourished.  HENT:  Head: Normocephalic and atraumatic.  Eyes: EOM are normal. Pupils are equal, round, and reactive to light.  Neck: Neck supple.  Cardiovascular: Normal rate, regular rhythm and intact distal pulses.   Pulmonary/Chest: Effort normal and breath sounds normal. No respiratory distress. He has no wheezes.  Reproducible right anterior chest wall tenderness. No crepitus. No rash.  Abdominal: Soft. He exhibits no distension. There is no tenderness.  Musculoskeletal: Normal range of motion. He exhibits no edema and no tenderness.  Neurological: He is alert and oriented to person, place, and time.  Skin: Skin is warm and dry.    ED Course  Procedures (including critical care time) Labs Review Labs Reviewed - No data to display Imaging Review Dg Chest 2 View  07/19/2013   CLINICAL DATA:  Chest pain  EXAM: CHEST  2 VIEW  COMPARISON:  03/24/2013  FINDINGS: Normal heart size and mediastinal contours. No acute infiltrate or edema. No effusion or pneumothorax. No acute osseous findings.  IMPRESSION: No active cardiopulmonary disease.   Electronically Signed   By: Tiburcio PeaJonathan  Watts M.D.   On: 07/19/2013 01:24     EKG Interpretation   Date/Time:  Saturday July 18 2013 23:48:29 EST Ventricular Rate:  69 PR Interval:  125 QRS Duration: 83 QT Interval:  411 QTC Calculation: 440 R Axis:   46 Text Interpretation:  Sinus rhythm Nonspecific ST abnormality No old  tracing to compare Confirmed by Kaniel Kiang  MD, Elody Kleinsasser ([id number]) on 07/18/2013  11:53:15 PM     Murmur pulse ox 97% is adequate.  IV Solu Medrol. Albuterol breathing treatment provided. On recheck is feeling much better and would like to be discharged home. Lung sounds remain clear without  respiratory distress.  Chest x-ray and EKG reviewed as above. Plan Discharge home with albuterol inhaler, prescription for prednisone and antitussives as needed. Patient agrees to strict return precautions and is appropriate for discharge at this time. MDM   Diagnosis: Bronchitis  Presents chest pain cough history of asthma and a smoker. No fever. Chest x-ray clear. Likely viral process.  EKG reviewed no arrhythmia or ischemia. Vital signs and nursing notes reviewed and considered.   Sunnie Nielsen, MD 07/19/13 [phone]

## 2013-07-19 NOTE — Discharge Instructions (Signed)
Bronchitis  Bronchitis is inflammation of the airways that extend from the windpipe into the lungs (bronchi). The inflammation often causes mucus to develop, which leads to a cough. If the inflammation becomes severe, it may cause shortness of breath.  CAUSES   Bronchitis may be caused by:   · Viral infections.    · Bacteria.    · Cigarette smoke.    · Allergens, pollutants, and other irritants.    SIGNS AND SYMPTOMS   The most common symptom of bronchitis is a frequent cough that produces mucus. Other symptoms include:  · Fever.    · Body aches.    · Chest congestion.    · Chills.    · Shortness of breath.    · Sore throat.    DIAGNOSIS   Bronchitis is usually diagnosed through a medical history and physical exam. Tests, such as chest X-rays, are sometimes done to rule out other conditions.   TREATMENT   You may need to avoid contact with whatever caused the problem (smoking, for example). Medicines are sometimes needed. These may include:  · Antibiotics. These may be prescribed if the condition is caused by bacteria.  · Cough suppressants. These may be prescribed for relief of cough symptoms.    · Inhaled medicines. These may be prescribed to help open your airways and make it easier for you to breathe.    · Steroid medicines. These may be prescribed for those with recurrent (chronic) bronchitis.  HOME CARE INSTRUCTIONS  · Get plenty of rest.    · Drink enough fluids to keep your urine clear or pale yellow (unless you have a medical condition that requires fluid restriction). Increasing fluids may help thin your secretions and will prevent dehydration.    · Only take over-the-counter or prescription medicines as directed by your health care provider.  · Only take antibiotics as directed. Make sure you finish them even if you start to feel better.  · Avoid secondhand smoke, irritating chemicals, and strong fumes. These will make bronchitis worse. If you are a smoker, quit smoking. Consider using nicotine gum or  skin patches to help control withdrawal symptoms. Quitting smoking will help your lungs heal faster.    · Put a cool-mist humidifier in your bedroom at night to moisten the air. This may help loosen mucus. Change the water in the humidifier daily. You can also run the hot water in your shower and sit in the bathroom with the door closed for 5 10 minutes.    · Follow up with your health care provider as directed.    · Wash your hands frequently to avoid catching bronchitis again or spreading an infection to others.    SEEK MEDICAL CARE IF:  Your symptoms do not improve after 1 week of treatment.   SEEK IMMEDIATE MEDICAL CARE IF:  · Your fever increases.  · You have chills.    · You have chest pain.    · You have worsening shortness of breath.    · You have bloody sputum.  · You faint.    · You have lightheadedness.  · You have a severe headache.    · You vomit repeatedly.  MAKE SURE YOU:   · Understand these instructions.  · Will watch your condition.  · Will get help right away if you are not doing well or get worse.  Document Released: 04/30/2005 Document Revised: 02/18/2013 Document Reviewed: 12/23/2012  ExitCare® Patient Information ©2014 [employer].    Antibiotic Nonuse   Your caregiver felt that the infection or problem was not one that would be helped with an antibiotic.  Infections   may be caused by viruses or bacteria. Only a caregiver can tell which one of these is the likely cause of an illness. A cold is the most common cause of infection in both adults and children. A cold is a virus. Antibiotic treatment will have no effect on a viral infection. Viruses can lead to many lost days of work caring for sick children and many missed days of school. Children may catch as many as 10 "colds" or "flus" per year during which they can be tearful, cranky, and uncomfortable. The goal of treating a virus is aimed at keeping the ill person comfortable.  Antibiotics are medications used to help the body fight  bacterial infections. There are relatively few types of bacteria that cause infections but there are hundreds of viruses. While both viruses and bacteria cause infection they are very different types of germs. A viral infection will typically go away by itself within 7 to 10 days. Bacterial infections may spread or get worse without antibiotic treatment.  Examples of bacterial infections are:  · Sore throats (like strep throat or tonsillitis).  · Infection in the lung (pneumonia).  · Ear and skin infections.  Examples of viral infections are:  · Colds or flus.  · Most coughs and bronchitis.  · Sore throats not caused by Strep.  · Runny noses.  It is often best not to take an antibiotic when a viral infection is the cause of the problem. Antibiotics can kill off the helpful bacteria that we have inside our body and allow harmful bacteria to start growing. Antibiotics can cause side effects such as allergies, nausea, and diarrhea without helping to improve the symptoms of the viral infection. Additionally, repeated uses of antibiotics can cause bacteria inside of our body to become resistant. That resistance can be passed onto harmful bacterial. The next time you have an infection it may be harder to treat if antibiotics are used when they are not needed. Not treating with antibiotics allows our own immune system to develop and take care of infections more efficiently. Also, antibiotics will work better for us when they are prescribed for bacterial infections.  Treatments for a child that is ill may include:  · Give extra fluids throughout the day to stay hydrated.  · Get plenty of rest.  · Only give your child over-the-counter or prescription medicines for pain, discomfort, or fever as directed by your caregiver.  · The use of a cool mist humidifier may help stuffy noses.  · Cold medications if suggested by your caregiver.  Your caregiver may decide to start you on an antibiotic if:  · The problem you were seen for  today continues for a longer length of time than expected.  · You develop a secondary bacterial infection.  SEEK MEDICAL CARE IF:  · Fever lasts longer than 5 days.  · Symptoms continue to get worse after 5 to 7 days or become severe.  · Difficulty in breathing develops.  · Signs of dehydration develop (poor drinking, rare urinating, dark colored urine).  · Changes in behavior or worsening tiredness (listlessness or lethargy).  Document Released: 07/09/2001 Document Revised: 07/23/2011 Document Reviewed: 01/05/2009  ExitCare® Patient Information ©2014 [employer].

## 2013-07-19 NOTE — ED Notes (Signed)
Chest pressure x 4 days,constant, 10/10, nausea, vomiting, SOB, Wheezing yesterday, productive cough. Right chest tender to palpate, worsen with respiration. Lungs are clear, S1 S2 sinus, VS stable. Pt is stable. In NAD

## 2013-07-30 ENCOUNTER — Observation Stay (HOSPITAL_COMMUNITY)
Admission: EM | Admit: 2013-07-30 | Discharge: 2013-07-30 | Disposition: A | Discharge status: Home / Self Care | Payer: Self-pay | Attending: Emergency Medicine | Admitting: Emergency Medicine

## 2013-07-30 DIAGNOSIS — R0789 Other chest pain: Secondary | ICD-10-CM

## 2013-07-30 DIAGNOSIS — R079 Chest pain, unspecified: Principal | ICD-10-CM | POA: Insufficient documentation

## 2013-07-30 DIAGNOSIS — R55 Syncope and collapse: Secondary | ICD-10-CM | POA: Diagnosis present

## 2013-07-30 DIAGNOSIS — F17200 Nicotine dependence, unspecified, uncomplicated: Secondary | ICD-10-CM | POA: Insufficient documentation

## 2013-07-30 LAB — CBC
HCT: 46 % (ref 39.0–52.0)
Hemoglobin: 15.8 g/dL (ref 13.0–17.0)
MCH: 27.9 pg (ref 26.0–34.0)
MCHC: 34.3 g/dL (ref 30.0–36.0)
MCV: 81.3 fL (ref 78.0–100.0)
Platelets: 302 10*3/uL (ref 150–400)
RBC: 5.66 MIL/uL (ref 4.22–5.81)
RDW: 14.6 % (ref 11.5–15.5)
WBC: 13.5 10*3/uL — ABNORMAL HIGH (ref 4.0–10.5)

## 2013-07-30 LAB — BASIC METABOLIC PANEL
BUN: 12 mg/dL (ref 6–23)
CALCIUM: 9.5 mg/dL (ref 8.4–10.5)
CO2: 27 meq/L (ref 19–32)
Chloride: 100 mEq/L (ref 96–112)
Creatinine, Ser: 1.21 mg/dL (ref 0.50–1.35)
GFR calc Af Amer: 88 mL/min — ABNORMAL LOW (ref >90)
GFR, EST NON AFRICAN AMERICAN: 76 mL/min — AB (ref >90)
GLUCOSE: 137 mg/dL — AB (ref 70–99)
Potassium: 4 mEq/L (ref 3.7–5.3)
SODIUM: 141 meq/L (ref 137–147)

## 2013-07-30 LAB — I-STAT TROPONIN, ED: TROPONIN I, POC: 0 ng/mL (ref 0.00–0.08)

## 2013-07-30 MED ORDER — METHOCARBAMOL 500 MG PO TABS
1000.0000 mg | ORAL_TABLET | Freq: Once | ORAL | Status: AC
  Administered 2013-07-30: 1000 mg via ORAL
  Filled 2013-07-30: qty 2

## 2013-07-30 MED ORDER — METHOCARBAMOL 750 MG PO TABS: 750.0000 mg | ORAL_TABLET | Freq: Four times a day (QID) | ORAL | Status: DC

## 2013-07-30 NOTE — ED Provider Notes (Signed)
CSN: [id number]     Arrival date & time 07/30/13  0136 History   First MD Initiated Contact with Patient 07/30/13 0249     Chief Complaint  Patient presents with  . Chest Pain     (Consider location/radiation/quality/duration/timing/severity/associated sxs/prior Treatment) HPI 38 yo male presents to the ER from home with complaint of right sided chest pain for the last 24-30 hours.  Pt recently dx with bronchitis, finished up course of prednisone and robitussin last week.  Pt is still smoking.  Pain is sharp, worse with movement, deep breathing, cough.  No fevers, chills.  No leg swelling or sob. Past Medical History  Diagnosis Date  . Asthma    Past Surgical History  Procedure Laterality Date  . Orchectomy Left    Family History  Problem Relation Age of Onset  . Hypertension Mother   . Hypertension Father    History  Substance Use Topics  . Smoking status: Current Every Day Smoker -- 0.50 packs/day for 5 years    Types: Cigarettes  . Smokeless tobacco: Never Used  . Alcohol Use: Yes     Comment: socially    Review of Systems  See History of Present Illness; otherwise all other systems are reviewed and negative   Allergies  Fish allergy; Tramadol; Tylenol; and Ibuprofen  Home Medications   Current Outpatient Rx  Name  Route  Sig  Dispense  Refill  . albuterol (PROVENTIL HFA;VENTOLIN HFA) 108 (90 BASE) MCG/ACT inhaler   Inhalation   Inhale 1-2 puffs into the lungs every 6 (six) hours as needed for wheezing or shortness of breath.   1 Inhaler   0   . guaiFENesin (ROBITUSSIN) 100 MG/5ML liquid   Oral   Take 5-10 mLs (100-200 mg total) by mouth every 4 (four) hours as needed for cough.   60 mL   0   . methocarbamol (ROBAXIN-750) 750 MG tablet   Oral   Take 1 tablet (750 mg total) by mouth 4 (four) times daily.   40 tablet   0   . predniSONE (DELTASONE) 20 MG tablet   Oral   Take 3 tablets (60 mg total) by mouth daily.   15 tablet   0    BP 115/49   Pulse 66  Temp(Src) 98.2 F (36.8 C) (Oral)  Resp 24  SpO2 98% Physical Exam  Nursing note and vitals reviewed. Constitutional: He is oriented to person, place, and time. He appears well-developed and well-nourished.  HENT:  Head: Normocephalic and atraumatic.  Right Ear: External ear normal.  Left Ear: External ear normal.  Nose: Nose normal.  Mouth/Throat: Oropharynx is clear and moist.  Eyes: Conjunctivae and EOM are normal. Pupils are equal, round, and reactive to light.  Neck: Normal range of motion. Neck supple. No JVD present. No tracheal deviation present. No thyromegaly present.  Cardiovascular: Normal rate, regular rhythm, normal heart sounds and intact distal pulses.  Exam reveals no gallop and no friction rub.   No murmur heard. Pulmonary/Chest: Effort normal and breath sounds normal. No stridor. No respiratory distress. He has no wheezes. He has no rales. He exhibits tenderness (tender to palpation over right chest wall starting at 6 rib and wrapping around).  Abdominal: Soft. Bowel sounds are normal. He exhibits no distension and no mass. There is no tenderness. There is no rebound and no guarding.  Musculoskeletal: Normal range of motion. He exhibits no edema and no tenderness.  Lymphadenopathy:    He has no cervical adenopathy.  Neurological: He is alert and oriented to person, place, and time. He exhibits normal muscle tone. Coordination normal.  Skin: Skin is warm and dry. No rash noted. No erythema. No pallor.  No overlying skin rashes noted  Psychiatric: He has a normal mood and affect. His behavior is normal. Judgment and thought content normal.    ED Course  Procedures (including critical care time) Labs Review Labs Reviewed  BASIC METABOLIC PANEL - Abnormal; Notable for the following:    Glucose, Bld 137 (*)    GFR calc non Af Amer 76 (*)    GFR calc Af Amer 88 (*)    All other components within normal limits  CBC - Abnormal; Notable for the following:     WBC 13.5 (*)    All other components within normal limits  I-STAT TROPOININ, ED   Imaging Review No results found.   EKG Interpretation None      Date: 07/30/2013  Rate: 70  Rhythm: normal sinus rhythm  QRS Axis: normal  Intervals: normal  ST/T Wave abnormalities: nonspecific T wave changes  Conduction Disutrbances:none  Narrative Interpretation: t wave inversion lead III slightly deeper than on prior  Old EKG Reviewed: changes noted   MDM   Final diagnoses:  Chest wall pain    38 year old male with chest wall pain.  Workup here unremarkable.  Pain is reproducible with palpation of the chest wall.  Patient recently diagnosed with bronchitis, no fevers to suggest pneumonia.  Suspect chest wall strain.  Patient has multiple allergies, will start on Robaxin for muscle strain relief.   Olivia Mackielga M Jenaro Souder, MD 07/30/13 [phone]

## 2013-07-30 NOTE — Discharge Instructions (Signed)

## 2013-07-30 NOTE — ED Notes (Signed)
[facility]. Took prescribed meds. X 3 days and feeling good. Now, chest wall  Pain, non productive coughing, wheezing resolved with albuterol inhaler.

## 2013-07-30 NOTE — ED Notes (Signed)
Patient has ride home with girlfriend 

## 2013-08-21 ENCOUNTER — Emergency Department (HOSPITAL_COMMUNITY): Payer: Self-pay

## 2013-08-21 ENCOUNTER — Encounter (HOSPITAL_COMMUNITY): Payer: Self-pay | Admitting: Emergency Medicine

## 2013-08-21 ENCOUNTER — Emergency Department (HOSPITAL_COMMUNITY)
Admission: EM | Admit: 2013-08-21 | Discharge: 2013-08-21 | Disposition: A | Discharge status: Home / Self Care | Payer: Self-pay | Attending: Emergency Medicine | Admitting: Emergency Medicine

## 2013-08-21 DIAGNOSIS — F17200 Nicotine dependence, unspecified, uncomplicated: Secondary | ICD-10-CM | POA: Insufficient documentation

## 2013-08-21 DIAGNOSIS — K219 Gastro-esophageal reflux disease without esophagitis: Secondary | ICD-10-CM | POA: Insufficient documentation

## 2013-08-21 DIAGNOSIS — J45901 Unspecified asthma with (acute) exacerbation: Secondary | ICD-10-CM | POA: Insufficient documentation

## 2013-08-21 DIAGNOSIS — R0789 Other chest pain: Secondary | ICD-10-CM | POA: Insufficient documentation

## 2013-08-21 DIAGNOSIS — Z79899 Other long term (current) drug therapy: Secondary | ICD-10-CM | POA: Insufficient documentation

## 2013-08-21 LAB — BASIC METABOLIC PANEL
BUN: 14 mg/dL (ref 6–23)
CALCIUM: 9 mg/dL (ref 8.4–10.5)
CO2: 26 meq/L (ref 19–32)
Chloride: 100 mEq/L (ref 96–112)
Creatinine, Ser: 1.26 mg/dL (ref 0.50–1.35)
GFR calc Af Amer: 83 mL/min — ABNORMAL LOW (ref >90)
GFR calc non Af Amer: 72 mL/min — ABNORMAL LOW (ref >90)
Glucose, Bld: 119 mg/dL — ABNORMAL HIGH (ref 70–99)
POTASSIUM: 3.8 meq/L (ref 3.7–5.3)
SODIUM: 141 meq/L (ref 137–147)

## 2013-08-21 LAB — CBC
HCT: 42.6 % (ref 39.0–52.0)
Hemoglobin: 14.4 g/dL (ref 13.0–17.0)
MCH: 27.6 pg (ref 26.0–34.0)
MCHC: 33.8 g/dL (ref 30.0–36.0)
MCV: 81.6 fL (ref 78.0–100.0)
Platelets: 254 10*3/uL (ref 150–400)
RBC: 5.22 MIL/uL (ref 4.22–5.81)
RDW: 14.2 % (ref 11.5–15.5)
WBC: 9.1 10*3/uL (ref 4.0–10.5)

## 2013-08-21 LAB — DIFFERENTIAL
BASOS ABS: 0 10*3/uL (ref 0.0–0.1)
Basophils Relative: 0 % (ref 0–1)
EOS PCT: 4 % (ref 0–5)
Eosinophils Absolute: 0.3 10*3/uL (ref 0.0–0.7)
LYMPHS PCT: 36 % (ref 12–46)
Lymphs Abs: 3.3 10*3/uL (ref 0.7–4.0)
Monocytes Absolute: 0.8 10*3/uL (ref 0.1–1.0)
Monocytes Relative: 9 % (ref 3–12)
NEUTROS PCT: 51 % (ref 43–77)
Neutro Abs: 4.7 10*3/uL (ref 1.7–7.7)

## 2013-08-21 LAB — TROPONIN I

## 2013-08-21 LAB — PRO B NATRIURETIC PEPTIDE: PRO B NATRI PEPTIDE: 30.6 pg/mL (ref 0–125)

## 2013-08-21 MED ORDER — GI COCKTAIL ~~LOC~~
30.0000 mL | Freq: Once | ORAL | Status: AC
  Administered 2013-08-21: 30 mL via ORAL
  Filled 2013-08-21: qty 30

## 2013-08-21 MED ORDER — PREDNISONE 20 MG PO TABS: 40.0000 mg | ORAL_TABLET | Freq: Every day | ORAL | Status: DC

## 2013-08-21 MED ORDER — PANTOPRAZOLE SODIUM 40 MG PO TBEC
40.0000 mg | DELAYED_RELEASE_TABLET | Freq: Once | ORAL | Status: AC
  Administered 2013-08-21: 40 mg via ORAL
  Filled 2013-08-21: qty 1

## 2013-08-21 MED ORDER — PREDNISONE 20 MG PO TABS
60.0000 mg | ORAL_TABLET | Freq: Once | ORAL | Status: AC
  Administered 2013-08-21: 60 mg via ORAL
  Filled 2013-08-21: qty 3

## 2013-08-21 MED ORDER — IPRATROPIUM-ALBUTEROL 0.5-2.5 (3) MG/3ML IN SOLN
3.0000 mL | Freq: Once | RESPIRATORY_TRACT | Status: AC
  Administered 2013-08-21: 3 mL via RESPIRATORY_TRACT
  Filled 2013-08-21: qty 3

## 2013-08-21 NOTE — ED Notes (Signed)
Pt states he has been having right sided chest pain for three days that has gotten progressivly worse until he felt he needed to come to the ED tonight

## 2013-08-21 NOTE — ED Provider Notes (Signed)
CSN: [id number]     Arrival date & time 08/21/13  [id number] History   First MD Initiated Contact with Patient 08/21/13 0542     Chief Complaint  Patient presents with  . Chest Pain     (Consider location/radiation/quality/duration/timing/severity/associated sxs/prior Treatment) Patient is a 38 y.o. male presenting with chest pain. The history is provided by the patient.  Chest Pain He complains of a burning right anterior chest pain for the last 3 days. Pain is worse with taking a deep breath, with laying flat, and with eating. Nothing makes it better. There is associated dyspnea but no nausea or vomiting. He has had some occasional sweats. He has not done anything to try and treat this pain. He has a history of asthma but still smokes. He rates his pain a 10/10.  Past Medical History  Diagnosis Date  . Asthma    Past Surgical History  Procedure Laterality Date  . Orchectomy Left    Family History  Problem Relation Age of Onset  . Hypertension Mother   . Hypertension Father    History  Substance Use Topics  . Smoking status: Current Every Day Smoker -- 0.50 packs/day for 5 years    Types: Cigarettes  . Smokeless tobacco: Never Used  . Alcohol Use: Yes     Comment: socially    Review of Systems  Cardiovascular: Positive for chest pain.  All other systems reviewed and are negative.     Allergies  Fish allergy; Tramadol; Tylenol; and Ibuprofen  Home Medications   Current Outpatient Rx  Name  Route  Sig  Dispense  Refill  . albuterol (PROVENTIL HFA;VENTOLIN HFA) 108 (90 BASE) MCG/ACT inhaler   Inhalation   Inhale 1-2 puffs into the lungs every 6 (six) hours as needed for wheezing or shortness of breath.   1 Inhaler   0   . predniSONE (DELTASONE) 20 MG tablet   Oral   Take 3 tablets (60 mg total) by mouth daily.   15 tablet   0    BP 143/87  Pulse 63  Temp(Src) 98.2 F (36.8 C) (Oral)  Resp 18  Ht 6\' 2"  (1.88 m)  Wt 230 lb (104.327 kg)  BMI 29.52 kg/m2   SpO2 99% Physical Exam  Nursing note and vitals reviewed.  38 year old male, resting comfortably and in no acute distress. Vital signs are significant for hypertension with blood pressure 143/87. Oxygen saturation is 99%, which is normal. Head is normocephalic and atraumatic. PERRLA, EOMI. Oropharynx is clear. Neck is nontender and supple without adenopathy or JVD. Back is nontender and there is no CVA tenderness. Lungs have diffuse expiratory wheezes without rales or rhonchi. Chest is nontender. Heart has regular rate and rhythm without murmur. Abdomen is soft, flat, nontender without masses or hepatosplenomegaly and peristalsis is normoactive. Extremities have no cyanosis or edema, full range of motion is present. Skin is warm and dry without rash. Neurologic: Mental status is normal, cranial nerves are intact, there are no motor or sensory deficits.  ED Course  Procedures (including critical care time) Labs Review Results for orders placed during the hospital encounter of 08/21/13  CBC      Result Value Ref Range   WBC 9.1  4.0 - 10.5 K/uL   RBC 5.22  4.22 - 5.81 MIL/uL   Hemoglobin 14.4  13.0 - 17.0 g/dL   HCT 72.542.6  36.639.0 - 44.052.0 %   MCV 81.6  78.0 - 100.0 fL   MCH 27.6  26.0 - 34.0 pg   MCHC 33.8  30.0 - 36.0 g/dL   RDW 16.1  09.6 - 04.5 %   Platelets 254  150 - 400 K/uL  BASIC METABOLIC PANEL      Result Value Ref Range   Sodium 141  137 - 147 mEq/L   Potassium 3.8  3.7 - 5.3 mEq/L   Chloride 100  96 - 112 mEq/L   CO2 26  19 - 32 mEq/L   Glucose, Bld 119 (*) 70 - 99 mg/dL   BUN 14  6 - 23 mg/dL   Creatinine, Ser 4.09  0.50 - 1.35 mg/dL   Calcium 9.0  8.4 - 81.1 mg/dL   GFR calc non Af Amer 72 (*) >90 mL/min   GFR calc Af Amer 83 (*) >90 mL/min  PRO B NATRIURETIC PEPTIDE      Result Value Ref Range   Pro B Natriuretic peptide (BNP) 30.6  0 - 125 pg/mL  TROPONIN I      Result Value Ref Range   Troponin I <0.30  <0.30 ng/mL  DIFFERENTIAL      Result Value Ref Range    Neutrophils Relative % 51  43 - 77 %   Neutro Abs 4.7  1.7 - 7.7 K/uL   Lymphocytes Relative 36  12 - 46 %   Lymphs Abs 3.3  0.7 - 4.0 K/uL   Monocytes Relative 9  3 - 12 %   Monocytes Absolute 0.8  0.1 - 1.0 K/uL   Eosinophils Relative 4  0 - 5 %   Eosinophils Absolute 0.3  0.0 - 0.7 K/uL   Basophils Relative 0  0 - 1 %   Basophils Absolute 0.0  0.0 - 0.1 K/uL   Imaging Review Dg Chest 2 View  08/21/2013   CLINICAL DATA:  Chest pain  EXAM: CHEST  2 VIEW  COMPARISON:  07/19/2013  FINDINGS: Normal heart size and mediastinal contours. No acute infiltrate or edema. No effusion or pneumothorax. No acute osseous findings.  IMPRESSION: No active cardiopulmonary disease.   Electronically Signed   By: Tiburcio Pea M.D.   On: 08/21/2013 05:53   ECG shows normal sinus rhythm with a rate of 65, no ectopy. Normal axis. Normal P wave. Normal QRS. Normal intervals. Normal ST and T waves. Impression: normal ECG. Compared with ECG of 08/01/2011, no significant changes are seen..  MDM   Final diagnoses:  GERD (gastroesophageal reflux disease)  Asthma exacerbation  Non-cardiac chest pain    Chest pain which may be multifactorial. He clearly has some evidence of bronchospasm and will be given albuterol with ipratropium by nebulizer. However, burning quality pain and worse with meals and supine suggests possible of gastroesophageal reflux. You'll be given GI cocktail and a dose of pantoprazole. Old records are reviewed and he has prior ED visits for chest pain and asthma.  He got good relief with the above-noted treatment. He is advised to buy Prilosec OTC and take it daily and he is urged to stop smoking. He is given a dose of prednisone and is given a prescription for prednisone.  Dione Booze, MD 08/21/13 0700

## 2013-08-21 NOTE — Discharge Instructions (Signed)
Take Prilosec OTC once a day. STOP SMOKING!!  Gastroesophageal Reflux Disease, Adult Gastroesophageal reflux disease (GERD) happens when acid from your stomach flows up into the esophagus. When acid comes in contact with the esophagus, the acid causes soreness (inflammation) in the esophagus. Over time, GERD may create small holes (ulcers) in the lining of the esophagus. CAUSES   Increased body weight. This puts pressure on the stomach, making acid rise from the stomach into the esophagus.  Smoking. This increases acid production in the stomach.  Drinking alcohol. This causes decreased pressure in the lower esophageal sphincter (valve or ring of muscle between the esophagus and stomach), allowing acid from the stomach into the esophagus.  Late evening meals and a full stomach. This increases pressure and acid production in the stomach.  A malformed lower esophageal sphincter. Sometimes, no cause is found. SYMPTOMS   Burning pain in the lower part of the mid-chest behind the breastbone and in the mid-stomach area. This may occur twice a week or more often.  Trouble swallowing.  Sore throat.  Dry cough.  Asthma-like symptoms including chest tightness, shortness of breath, or wheezing. DIAGNOSIS  Your caregiver may be able to diagnose GERD based on your symptoms. In some cases, X-rays and other tests may be done to check for complications or to check the condition of your stomach and esophagus. TREATMENT  Your caregiver may recommend over-the-counter or prescription medicines to help decrease acid production. Ask your caregiver before starting or adding any new medicines.  HOME CARE INSTRUCTIONS   Change the factors that you can control. Ask your caregiver for guidance concerning weight loss, quitting smoking, and alcohol consumption.  Avoid foods and drinks that make your symptoms worse, such as:  Caffeine or alcoholic drinks.  Chocolate.  Peppermint or mint  flavorings.  Garlic and onions.  Spicy foods.  Citrus fruits, such as oranges, lemons, or limes.  Tomato-based foods such as sauce, chili, salsa, and pizza.  Fried and fatty foods.  Avoid lying down for the 3 hours prior to your bedtime or prior to taking a nap.  Eat small, frequent meals instead of large meals.  Wear loose-fitting clothing. Do not wear anything tight around your waist that causes pressure on your stomach.  Raise the head of your bed 6 to 8 inches with wood blocks to help you sleep. Extra pillows will not help.  Only take over-the-counter or prescription medicines for pain, discomfort, or fever as directed by your caregiver.  Do not take aspirin, ibuprofen, or other nonsteroidal anti-inflammatory drugs (NSAIDs). SEEK IMMEDIATE MEDICAL CARE IF:   You have pain in your arms, neck, jaw, teeth, or back.  Your pain increases or changes in intensity or duration.  You develop nausea, vomiting, or sweating (diaphoresis).  You develop shortness of breath, or you faint.  Your vomit is green, yellow, black, or looks like coffee grounds or blood.  Your stool is red, bloody, or black. These symptoms could be signs of other problems, such as heart disease, gastric bleeding, or esophageal bleeding. MAKE SURE YOU:   Understand these instructions.  Will watch your condition.  Will get help right away if you are not doing well or get worse. Document Released: 02/07/2005 Document Revised: 07/23/2011 Document Reviewed: 11/17/2010 [facility] Patient Information 2014 [place], [place].  Asthma, Adult Asthma is a recurring condition in which the airways tighten and narrow. Asthma can make it difficult to breathe. It can cause coughing, wheezing, and shortness of breath. Asthma episodes (also called asthma  attacks) range from minor to life-threatening. Asthma cannot be cured, but medicines and lifestyle changes can help control it. CAUSES Asthma is believed to be caused by  inherited (genetic) and environmental factors, but its exact cause is unknown. Asthma may be triggered by allergens, lung infections, or irritants in the air. Asthma triggers are different for each person. Common triggers include:   Animal dander.  Dust mites.  Cockroaches.  Pollen from trees or grass.  Mold.  Smoke.  Air pollutants such as dust, household cleaners, hair sprays, aerosol sprays, paint fumes, strong chemicals, or strong odors.  Cold air, weather changes, and winds (which increase molds and pollens in the air).  Strong emotional expressions such as crying or laughing hard.  Stress.  Certain medicines (such as aspirin) or types of drugs (such as beta-blockers).  Sulfites in foods and drinks. Foods and drinks that may contain sulfites include dried fruit, potato chips, and sparkling grape juice.  Infections or inflammatory conditions such as the flu, a cold, or an inflammation of the nasal membranes (rhinitis).  Gastroesophageal reflux disease (GERD).  Exercise or strenuous activity. SYMPTOMS Symptoms may occur immediately after asthma is triggered or many hours later. Symptoms include:  Wheezing.  Excessive nighttime or early morning coughing.  Frequent or severe coughing with a common cold.  Chest tightness.  Shortness of breath. DIAGNOSIS  The diagnosis of asthma is made by a review of your medical history and a physical exam. Tests may also be performed. These may include:  Lung function studies. These tests show how much air you breath in and out.  Allergy tests.  Imaging tests such as X-rays. TREATMENT  Asthma cannot be cured, but it can usually be controlled. Treatment involves identifying and avoiding your asthma triggers. It also involves medicines. There are 2 classes of medicine used for asthma treatment:   Controller medicines. These prevent asthma symptoms from occurring. They are usually taken every day.  Reliever or rescue medicines.  These quickly relieve asthma symptoms. They are used as needed and provide short-term relief. Your health care provider will help you create an asthma action plan. An asthma action plan is a written plan for managing and treating your asthma attacks. It includes a list of your asthma triggers and how they may be avoided. It also includes information on when medicines should be taken and when their dosage should be changed. An action plan may also involve the use of a device called a peak flow meter. A peak flow meter measures how well the lungs are working. It helps you monitor your condition. HOME CARE INSTRUCTIONS   Take medicine as directed by your health care provider. Speak with your health care provider if you have questions about how or when to take the medicines.  Use a peak flow meter as directed by your health care provider. Record and keep track of readings.  Understand and use the action plan to help minimize or stop an asthma attack without needing to seek medical care.  Control your home environment in the following ways to help prevent asthma attacks:  Do not smoke. Avoid being exposed to secondhand smoke.  Change your heating and air conditioning filter regularly.  Limit your use of fireplaces and wood stoves.  Get rid of pests (such as roaches and mice) and their droppings.  Throw away plants if you see mold on them.  Clean your floors and dust regularly. Use unscented cleaning products.  Try to have someone else vacuum for you  regularly. Stay out of rooms while they are being vacuumed and for a short while afterward. If you vacuum, use a dust mask from a hardware store, a double-layered or microfilter vacuum cleaner bag, or a vacuum cleaner with a HEPA filter.  Replace carpet with wood, tile, or vinyl flooring. Carpet can trap dander and dust.  Use allergy-proof pillows, mattress covers, and box spring covers.  Wash bed sheets and blankets every week in hot water and  dry them in a dryer.  Use blankets that are made of polyester or cotton.  Clean bathrooms and kitchens with bleach. If possible, have someone repaint the walls in these rooms with mold-resistant paint. Keep out of the rooms that are being cleaned and painted.  Wash hands frequently. SEEK MEDICAL CARE IF:   You have wheezing, shortness of breath, or a cough even if taking medicine to prevent attacks.  The colored mucus you cough up (sputum) is thicker than usual.  Your sputum changes from clear or white to yellow, green, gray, or bloody.  You have any problems that may be related to the medicines you are taking (such as a rash, itching, swelling, or trouble breathing).  You are using a reliever medicine more than 2 3 times per week.  Your peak flow is still at 50 79% of you personal best after following your action plan for 1 hour. SEEK IMMEDIATE MEDICAL CARE IF:   You seem to be getting worse and are unresponsive to treatment during an asthma attack.  You are short of breath even at rest.  You get short of breath when doing very little physical activity.  You have difficulty eating, drinking, or talking due to asthma symptoms.  You develop chest pain.  You develop a fast heartbeat.  You have a bluish color to your lips or fingernails.  You are lightheaded, dizzy, or faint.  Your peak flow is less than 50% of your personal best.  You have a fever or persistent symptoms for more than 2 3 days.  You have a fever and symptoms suddenly get worse. MAKE SURE YOU:   Understand these instructions.  Will watch your condition.  Will get help right away if you are not doing well or get worse. Document Released: 04/30/2005 Document Revised: 12/31/2012 Document Reviewed: 11/27/2012 [facility] Patient Information 2014 [place], [place].  Smoking Hazards Smoking cigarettes is extremely bad for your health. Tobacco smoke has over 200 known poisons in it. It contains the poisonous  gases nitrogen oxide and carbon monoxide. There are over 60 chemicals in tobacco smoke that cause cancer. Some of the chemicals found in cigarette smoke include:   Cyanide.   Benzene.   Formaldehyde.   Methanol (wood alcohol).   Acetylene (fuel used in welding torches).   Ammonia.  Even smoking lightly shortens your life expectancy by several years. You can greatly reduce the risk of medical problems for you and your family by stopping now. Smoking is the most preventable cause of death and disease in our society. Within days of quitting smoking, your circulation improves, you decrease the risk of having a heart attack, and your lung capacity improves. There may be some increased phlegm in the first few days after quitting, and it may take months for your lungs to clear up completely. Quitting for 10 years reduces your risk of developing lung cancer to almost that of a nonsmoker.  WHAT ARE THE RISKS OF SMOKING? Cigarette smokers have an increased risk of many serious medical problems, including:  Lung cancer.   Lung disease (such as pneumonia, bronchitis, and emphysema).   Heart attack and chest pain due to the heart not getting enough oxygen (angina).   Heart disease and peripheral blood vessel disease.   Hypertension.   Stroke.   Oral cancer (cancer of the lip, mouth, or voice box).   Bladder cancer.   Pancreatic cancer.   Cervical cancer.   Pregnancy complications, including premature birth.   Stillbirths and smaller newborn babies, birth defects, and genetic damage to sperm.   Early menopause.   Lower estrogen level for women.   Infertility.   Facial wrinkles.   Blindness.   Increased risk of broken bones (fractures).   Senile dementia.   Stomach ulcers and internal bleeding.   Delayed wound healing and increased risk of complications during surgery. Because of secondhand smoke exposure, children of smokers have an increased risk  of the following:   Sudden infant death syndrome (SIDS).   Respiratory infections.   Lung cancer.   Heart disease.   Ear infections.  WHY IS SMOKING ADDICTIVE? Nicotine is the chemical agent in tobacco that is capable of causing addiction or dependence. When you smoke and inhale, nicotine is absorbed rapidly into the bloodstream through your lungs. Both inhaled and noninhaled nicotine may be addictive.  WHAT ARE THE BENEFITS OF QUITTING?  There are many health benefits to quitting smoking. Some are:   The likelihood of developing cancer and heart disease decreases. Health improvements are seen almost immediately.   Blood pressure, pulse rate, and breathing patterns start returning to normal soon after quitting.   People who quit may see an improvement in their overall quality of life.  HOW DO YOU QUIT SMOKING? Smoking is an addiction with both physical and psychological effects, and longtime habits can be hard to change. Your health care provider can recommend:  Programs and community resources, which may include group support, education, or therapy.  Replacement products, such as patches, gum, and nasal sprays. Use these products only as directed. Do not replace cigarette smoking with electronic cigarettes (commonly called e-cigarettes). The safety of e-cigarettes is unknown, and some may contain harmful chemicals. FOR MORE INFORMATION  American Lung Association: www.lung.org  American Cancer Society: www.cancer.org Document Released: 06/07/2004 Document Revised: 02/18/2013 Document Reviewed: 10/20/2012 [facility] Patient Information 2014 [place], [place].  Prednisone tablets What is this medicine? PREDNISONE (PRED ni sone) is a corticosteroid. It is commonly used to treat inflammation of the skin, joints, lungs, and other organs. Common conditions treated include asthma, allergies, and arthritis. It is also used for other conditions, such as blood disorders and diseases of  the adrenal glands. This medicine may be used for other purposes; ask your health care provider or pharmacist if you have questions. COMMON BRAND NAME(S): Deltasone, Predone, Sterapred DS, Sterapred What should I tell my health care provider before I take this medicine? They need to know if you have any of these conditions: -Cushing's syndrome -diabetes -glaucoma -heart disease -high blood pressure -infection (especially a virus infection such as chickenpox, cold sores, or herpes) -kidney disease -liver disease -mental illness -myasthenia gravis -osteoporosis -seizures -stomach or intestine problems -thyroid disease -an unusual or allergic reaction to lactose, prednisone, other medicines, foods, dyes, or preservatives -pregnant or trying to get pregnant -breast-feeding How should I use this medicine? Take this medicine by mouth with a glass of water. Follow the directions on the prescription label. Take this medicine with food. If you are taking this medicine once a day, take  it in the morning. Do not take more medicine than you are told to take. Do not suddenly stop taking your medicine because you may develop a severe reaction. Your doctor will tell you how much medicine to take. If your doctor wants you to stop the medicine, the dose may be slowly lowered over time to avoid any side effects. Talk to your pediatrician regarding the use of this medicine in children. Special care may be needed. Overdosage: If you think you have taken too much of this medicine contact a poison control center or emergency room at once. NOTE: This medicine is only for you. Do not share this medicine with others. What if I miss a dose? If you miss a dose, take it as soon as you can. If it is almost time for your next dose, talk to your doctor or health care professional. You may need to miss a dose or take an extra dose. Do not take double or extra doses without advice. What may interact with this  medicine? Do not take this medicine with any of the following medications: -metyrapone -mifepristone This medicine may also interact with the following medications: -aminoglutethimide -amphotericin B -aspirin and aspirin-like medicines -barbiturates -certain medicines for diabetes, like glipizide or glyburide -cholestyramine -cholinesterase inhibitors -cyclosporine -digoxin -diuretics -ephedrine -male hormones, like estrogens and birth control pills -isoniazid -ketoconazole -NSAIDS, medicines for pain and inflammation, like ibuprofen or naproxen -phenytoin -rifampin -toxoids -vaccines -warfarin This list may not describe all possible interactions. Give your health care provider a list of all the medicines, herbs, non-prescription drugs, or dietary supplements you use. Also tell them if you smoke, drink alcohol, or use illegal drugs. Some items may interact with your medicine. What should I watch for while using this medicine? Visit your doctor or health care professional for regular checks on your progress. If you are taking this medicine over a prolonged period, carry an identification card with your name and address, the type and dose of your medicine, and your doctor's name and address. This medicine may increase your risk of getting an infection. Tell your doctor or health care professional if you are around anyone with measles or chickenpox, or if you develop sores or blisters that do not heal properly. If you are going to have surgery, tell your doctor or health care professional that you have taken this medicine within the last twelve months. Ask your doctor or health care professional about your diet. You may need to lower the amount of salt you eat. This medicine may affect blood sugar levels. If you have diabetes, check with your doctor or health care professional before you change your diet or the dose of your diabetic medicine. What side effects may I notice from  receiving this medicine? Side effects that you should report to your doctor or health care professional as soon as possible: -allergic reactions like skin rash, itching or hives, swelling of the face, lips, or tongue -changes in emotions or moods -changes in vision -depressed mood -eye pain -fever or chills, cough, sore throat, pain or difficulty passing urine -increased thirst -swelling of ankles, feet Side effects that usually do not require medical attention (report to your doctor or health care professional if they continue or are bothersome): -confusion, excitement, restlessness -headache -nausea, vomiting -skin problems, acne, thin and shiny skin -trouble sleeping -weight gain This list may not describe all possible side effects. Call your doctor for medical advice about side effects. You may report side effects to FDA at  1-800-FDA-1088. Where should I keep my medicine? Keep out of the reach of children. Store at room temperature between 15 and 30 degrees C (59 and 86 degrees F). Protect from light. Keep container tightly closed. Throw away any unused medicine after the expiration date. NOTE: This sheet is a summary. It may not cover all possible information. If you have questions about this medicine, talk to your doctor, pharmacist, or health care provider.  2014, [employer]. (2010-12-14 10:57:14)

## 2013-12-06 ENCOUNTER — Encounter (HOSPITAL_COMMUNITY): Payer: Self-pay | Admitting: Emergency Medicine

## 2013-12-06 ENCOUNTER — Emergency Department (HOSPITAL_COMMUNITY)
Admission: EM | Admit: 2013-12-06 | Discharge: 2013-12-06 | Disposition: A | Discharge status: Home / Self Care | Payer: No Typology Code available for payment source | Attending: Emergency Medicine | Admitting: Emergency Medicine

## 2013-12-06 DIAGNOSIS — R112 Nausea with vomiting, unspecified: Secondary | ICD-10-CM | POA: Insufficient documentation

## 2013-12-06 DIAGNOSIS — F17200 Nicotine dependence, unspecified, uncomplicated: Secondary | ICD-10-CM | POA: Insufficient documentation

## 2013-12-06 DIAGNOSIS — J45909 Unspecified asthma, uncomplicated: Secondary | ICD-10-CM | POA: Insufficient documentation

## 2013-12-06 DIAGNOSIS — K0889 Other specified disorders of teeth and supporting structures: Secondary | ICD-10-CM

## 2013-12-06 DIAGNOSIS — Z79899 Other long term (current) drug therapy: Secondary | ICD-10-CM | POA: Insufficient documentation

## 2013-12-06 DIAGNOSIS — K0381 Cracked tooth: Secondary | ICD-10-CM | POA: Insufficient documentation

## 2013-12-06 DIAGNOSIS — K089 Disorder of teeth and supporting structures, unspecified: Secondary | ICD-10-CM | POA: Insufficient documentation

## 2013-12-06 MED ORDER — OXYCODONE HCL 5 MG PO TABS
10.0000 mg | ORAL_TABLET | Freq: Once | ORAL | Status: AC
  Administered 2013-12-06: 10 mg via ORAL
  Filled 2013-12-06: qty 2

## 2013-12-06 MED ORDER — OXYCODONE HCL 5 MG PO TABS: 5.0000 mg | ORAL_TABLET | Freq: Four times a day (QID) | ORAL | Status: DC | PRN

## 2013-12-06 MED ORDER — PENICILLIN V POTASSIUM 500 MG PO TABS
500.0000 mg | ORAL_TABLET | Freq: Four times a day (QID) | ORAL | Status: DC
Start: 2013-12-06 — End: 2014-01-17

## 2013-12-06 NOTE — ED Notes (Signed)
He states "ive had a cracked tooth that's been hurting since Friday."

## 2013-12-06 NOTE — ED Provider Notes (Signed)
Medical screening examination/treatment/procedure(s) were performed by non-physician practitioner and as supervising physician I was immediately available for consultation/collaboration.   EKG Interpretation None       Doug SouSam Yanelle Sousa, MD 12/06/13 1755

## 2013-12-06 NOTE — ED Notes (Signed)
Declined W/C at D/C and was escorted to lobby by RN. 

## 2013-12-06 NOTE — Discharge Instructions (Signed)

## 2013-12-06 NOTE — ED Provider Notes (Signed)
CSN: [id number]     Arrival date & time 12/06/13  1317 History  This chart was scribed for non-physician practitioner Junious SilkHannah Arturo Freundlich, PA-C, working with Doug SouSam Jacubowitz, MD by Leone PayorSonum Patel, ED Scribe. This patient was seen in room TR06C/TR06C and the patient's care was started at 3:30 PM.    Chief Complaint  Patient presents with  . Dental Pain    The history is provided by the patient. No language interpreter was used.    HPI Comments: Mark Cruz is a 38 y.o. male who presents to the Emergency Department complaining of 2 days of constant, gradually worsened right upper dental pain. He describes the pain as a throbbing sensation and states it radiates to the right ear. He has taken Aleve without relief. He reports having 1 episode of nausea and vomiting this morning. Patient states he plans to see a dentist to have the affected tooth extracted. He denies fever, chills.    Past Medical History  Diagnosis Date  . Asthma    Past Surgical History  Procedure Laterality Date  . Orchectomy Left    Family History  Problem Relation Age of Onset  . Hypertension Mother   . Hypertension Father    History  Substance Use Topics  . Smoking status: Current Every Day Smoker -- 0.50 packs/day for 5 years    Types: Cigarettes  . Smokeless tobacco: Never Used  . Alcohol Use: Yes     Comment: socially    Review of Systems  Constitutional: Negative for fever and chills.  HENT: Positive for dental problem and ear pain. Negative for facial swelling.   Gastrointestinal: Positive for nausea and vomiting. Negative for abdominal pain.  All other systems reviewed and are negative.     Allergies  Fish allergy; Tramadol; Tylenol; and Ibuprofen  Home Medications   Prior to Admission medications   Medication Sig Start Date End Date Taking? Authorizing Provider  albuterol (PROVENTIL HFA;VENTOLIN HFA) 108 (90 BASE) MCG/ACT inhaler Inhale 1-2 puffs into the lungs every 6 (six) hours as  needed for wheezing or shortness of breath. 03/24/13  Yes Heather Laisure, PA-C   BP 137/92  Pulse 76  Temp(Src) 98.2 F (36.8 C) (Oral)  Resp 16  SpO2 99% Physical Exam  Nursing note and vitals reviewed. Constitutional: He is oriented to person, place, and time. He appears well-developed and well-nourished. No distress.  HENT:  Head: Normocephalic and atraumatic.  Right Ear: Tympanic membrane, external ear and ear canal normal.  Left Ear: Tympanic membrane, external ear and ear canal normal.  Nose: Nose normal.  Broken right upper molar. No trismus, submental edema, or tongue elevation. No drainable abscess   Eyes: Conjunctivae are normal.  Neck: Normal range of motion. No tracheal deviation present.  Cardiovascular: Normal rate, regular rhythm and normal heart sounds.   Pulmonary/Chest: Effort normal and breath sounds normal. No stridor.  Abdominal: Soft. He exhibits no distension. There is no tenderness.  Musculoskeletal: Normal range of motion.  Neurological: He is alert and oriented to person, place, and time.  Skin: Skin is warm and dry. He is not diaphoretic.  Psychiatric: He has a normal mood and affect. His behavior is normal.    ED Course  Procedures (including critical care time)  DIAGNOSTIC STUDIES: Oxygen Saturation is 99% on RA, normal by my interpretation.    COORDINATION OF CARE: 3:34 PM Discussed treatment plan with pt at bedside and pt agreed to plan.   Labs Review Labs Reviewed - No data to  display  Imaging Review No results found.   EKG Interpretation None      MDM   Final diagnoses:  Pain, dental   Patient with toothache.  No gross abscess.  Exam unconcerning for Ludwig's angina or spread of infection.  Will treat with penicillin and pain medicine.  Urged patient to follow-up with dentist.    I personally performed the services described in this documentation, which was scribed in my presence. The recorded information has been reviewed and  is accurate.    Mora Bellman, PA-C 12/06/13 1627

## 2014-01-17 ENCOUNTER — Emergency Department (HOSPITAL_COMMUNITY)
Admission: EM | Admit: 2014-01-17 | Discharge: 2014-01-17 | Disposition: A | Discharge status: Home / Self Care | Payer: No Typology Code available for payment source | Attending: Emergency Medicine | Admitting: Emergency Medicine

## 2014-01-17 ENCOUNTER — Encounter (HOSPITAL_COMMUNITY): Payer: Self-pay | Admitting: Emergency Medicine

## 2014-01-17 DIAGNOSIS — F17200 Nicotine dependence, unspecified, uncomplicated: Secondary | ICD-10-CM | POA: Insufficient documentation

## 2014-01-17 DIAGNOSIS — K029 Dental caries, unspecified: Secondary | ICD-10-CM | POA: Insufficient documentation

## 2014-01-17 DIAGNOSIS — K0889 Other specified disorders of teeth and supporting structures: Secondary | ICD-10-CM

## 2014-01-17 DIAGNOSIS — Z79899 Other long term (current) drug therapy: Secondary | ICD-10-CM | POA: Insufficient documentation

## 2014-01-17 DIAGNOSIS — J45909 Unspecified asthma, uncomplicated: Secondary | ICD-10-CM | POA: Insufficient documentation

## 2014-01-17 DIAGNOSIS — K089 Disorder of teeth and supporting structures, unspecified: Secondary | ICD-10-CM | POA: Insufficient documentation

## 2014-01-17 MED ORDER — NAPROXEN 500 MG PO TABS: 500.0000 mg | ORAL_TABLET | Freq: Two times a day (BID) | ORAL | Status: DC

## 2014-01-17 MED ORDER — NAPROXEN 250 MG PO TABS
500.0000 mg | ORAL_TABLET | Freq: Once | ORAL | Status: AC
  Administered 2014-01-17: 500 mg via ORAL
  Filled 2014-01-17: qty 2

## 2014-01-17 MED ORDER — PENICILLIN V POTASSIUM 500 MG PO TABS: 500.0000 mg | ORAL_TABLET | Freq: Three times a day (TID) | ORAL | Status: DC

## 2014-01-17 NOTE — ED Provider Notes (Signed)
Medical screening examination/treatment/procedure(s) were performed by non-physician practitioner and as supervising physician I was immediately available for consultation/collaboration.   EKG Interpretation None        Kaulder Zahner, MD 01/17/14 0655 

## 2014-01-17 NOTE — Discharge Instructions (Signed)

## 2014-01-17 NOTE — ED Notes (Signed)
Pt states that he has been having pain for 24 hours. Pt reports that he has a cracked tooth on his right upper rear jaw that has started digging into his cheek. Pt states that he has developed pain in his right ear.

## 2014-01-17 NOTE — ED Provider Notes (Signed)
CSN: [id number]     Arrival date & time 01/17/14  0436 History   First MD Initiated Contact with Patient 01/17/14 0601     Chief Complaint  Patient presents with  . Dental Pain     (Consider location/radiation/quality/duration/timing/severity/associated sxs/prior Treatment) HPI  38 year old male presents complaining of dental pain. Patient reports gradual onset of sharp throbbing pain to his right upper teeth and radiates up to his right ear, persistent, worsening with chewing, and cold air. Pain is currently 10 out of 10, not improved with taking Aleve. No reported fever, no loss of hearing, no sore throat, and no neck pain. Denies any recent injury. States that she has a cracked tooth and is now beginning to dig into his his cheek.  Past Medical History  Diagnosis Date  . Asthma    Past Surgical History  Procedure Laterality Date  . Orchectomy Left    Family History  Problem Relation Age of Onset  . Hypertension Mother   . Hypertension Father    History  Substance Use Topics  . Smoking status: Current Every Day Smoker -- 0.50 packs/day for 5 years    Types: Cigarettes  . Smokeless tobacco: Never Used  . Alcohol Use: Yes     Comment: socially    Review of Systems  Constitutional: Negative for fever.  HENT: Positive for dental problem.   Skin: Negative for rash.      Allergies  Fish allergy; Tramadol; Tylenol; and Ibuprofen  Home Medications   Prior to Admission medications   Medication Sig Start Date End Date Taking? Authorizing Provider  albuterol (PROVENTIL HFA;VENTOLIN HFA) 108 (90 BASE) MCG/ACT inhaler Inhale 1-2 puffs into the lungs every 6 (six) hours as needed for wheezing or shortness of breath. 03/24/13   Heather Laisure, PA-C  oxyCODONE (ROXICODONE) 5 MG immediate release tablet Take 1 tablet (5 mg total) by mouth every 6 (six) hours as needed for severe pain. 12/06/13   Mora Bellman, PA-C  penicillin v potassium (VEETID) 500 MG tablet Take 1 tablet  (500 mg total) by mouth 4 (four) times daily. 12/06/13   Mora Bellman, PA-C   BP 148/101  Pulse 66  Temp(Src) 100.1 F (37.8 C) (Oral)  Resp 18  Ht  (1.88 m)  Wt 235 lb (106.595 kg)  BMI 30.16 kg/m2  SpO2 96% Physical Exam  Constitutional: He appears well-developed and well-nourished. No distress.  HENT:  Head: Atraumatic.  Right Ear: External ear normal.  Left Ear: External ear normal.  Mouth: A broken and decay right upper molar, tender to palpation, no tendon erythema, no abscess. No evidence of deep tissue infection, no trismus.  Eyes: Conjunctivae are normal.  Neck: Normal range of motion. Neck supple.  Lymphadenopathy:    He has no cervical adenopathy.  Neurological: He is alert.  Skin: No rash noted.  Psychiatric: He has a normal mood and affect.    ED Course  Procedures (including critical care time)  6:25 AM Dental pain, no evidence of deep tissue infection.  Was seen for same 1-2 months ago but did not f/u with dentist.  Will prescribe abx, NSAIDs and dental referral.    Labs Review Labs Reviewed - No data to display  Imaging Review No results found.   EKG Interpretation None      MDM   Final diagnoses:  Pain, dental    BP 148/101  Pulse 66  Temp(Src) 100.1 F (37.8 C) (Oral)  Resp 18  Ht  (  1.88 m)  Wt 235 lb (106.595 kg)  BMI 30.16 kg/m2  SpO2 96%      Fayrene Helper, PA-C 01/17/14 [phone]

## 2014-03-25 ENCOUNTER — Emergency Department (HOSPITAL_COMMUNITY)
Admission: EM | Admit: 2014-03-25 | Discharge: 2014-03-25 | Disposition: A | Discharge status: Home / Self Care | Payer: No Typology Code available for payment source | Attending: Emergency Medicine | Admitting: Emergency Medicine

## 2014-03-25 ENCOUNTER — Emergency Department (HOSPITAL_COMMUNITY): Payer: No Typology Code available for payment source

## 2014-03-25 ENCOUNTER — Encounter (HOSPITAL_COMMUNITY): Payer: Self-pay | Admitting: Emergency Medicine

## 2014-03-25 DIAGNOSIS — J45909 Unspecified asthma, uncomplicated: Secondary | ICD-10-CM | POA: Insufficient documentation

## 2014-03-25 DIAGNOSIS — R112 Nausea with vomiting, unspecified: Secondary | ICD-10-CM | POA: Insufficient documentation

## 2014-03-25 DIAGNOSIS — Z792 Long term (current) use of antibiotics: Secondary | ICD-10-CM | POA: Insufficient documentation

## 2014-03-25 DIAGNOSIS — Z720 Tobacco use: Secondary | ICD-10-CM | POA: Insufficient documentation

## 2014-03-25 DIAGNOSIS — H539 Unspecified visual disturbance: Secondary | ICD-10-CM | POA: Insufficient documentation

## 2014-03-25 DIAGNOSIS — R51 Headache: Secondary | ICD-10-CM | POA: Insufficient documentation

## 2014-03-25 DIAGNOSIS — Z79899 Other long term (current) drug therapy: Secondary | ICD-10-CM | POA: Insufficient documentation

## 2014-03-25 DIAGNOSIS — R519 Headache, unspecified: Secondary | ICD-10-CM

## 2014-03-25 DIAGNOSIS — Z791 Long term (current) use of non-steroidal anti-inflammatories (NSAID): Secondary | ICD-10-CM | POA: Insufficient documentation

## 2014-03-25 MED ORDER — MAGNESIUM SULFATE IN D5W 10-5 MG/ML-% IV SOLN
1.0000 g | Freq: Once | INTRAVENOUS | Status: AC
  Administered 2014-03-25: 1 g via INTRAVENOUS
  Filled 2014-03-25: qty 100

## 2014-03-25 MED ORDER — SODIUM CHLORIDE 0.9 % IV BOLUS (SEPSIS)
1000.0000 mL | Freq: Once | INTRAVENOUS | Status: AC
  Administered 2014-03-25: 1000 mL via INTRAVENOUS

## 2014-03-25 MED ORDER — DEXAMETHASONE SODIUM PHOSPHATE 10 MG/ML IJ SOLN
10.0000 mg | Freq: Once | INTRAMUSCULAR | Status: AC
  Administered 2014-03-25: 10 mg via INTRAVENOUS
  Filled 2014-03-25: qty 1

## 2014-03-25 MED ORDER — KETOROLAC TROMETHAMINE 30 MG/ML IJ SOLN
30.0000 mg | Freq: Once | INTRAMUSCULAR | Status: AC
  Administered 2014-03-25: 30 mg via INTRAVENOUS
  Filled 2014-03-25: qty 1

## 2014-03-25 MED ORDER — DIPHENHYDRAMINE HCL 50 MG/ML IJ SOLN
12.5000 mg | Freq: Once | INTRAMUSCULAR | Status: AC
  Administered 2014-03-25: 12.5 mg via INTRAVENOUS
  Filled 2014-03-25: qty 1

## 2014-03-25 MED ORDER — METOCLOPRAMIDE HCL 5 MG/ML IJ SOLN
10.0000 mg | Freq: Once | INTRAMUSCULAR | Status: AC
  Administered 2014-03-25: 10 mg via INTRAVENOUS
  Filled 2014-03-25: qty 2

## 2014-03-25 MED ORDER — DIPHENHYDRAMINE HCL 50 MG/ML IJ SOLN
25.0000 mg | Freq: Once | INTRAMUSCULAR | Status: AC
  Administered 2014-03-25: 25 mg via INTRAVENOUS

## 2014-03-25 MED ORDER — PROCHLORPERAZINE EDISYLATE 5 MG/ML IJ SOLN
10.0000 mg | Freq: Four times a day (QID) | INTRAMUSCULAR | Status: DC | PRN
  Filled 2014-03-25: qty 2

## 2014-03-25 MED ORDER — PROMETHAZINE HCL 25 MG/ML IJ SOLN
25.0000 mg | Freq: Four times a day (QID) | INTRAMUSCULAR | Status: DC | PRN
  Administered 2014-03-25: 25 mg via INTRAVENOUS
  Filled 2014-03-25: qty 1

## 2014-03-25 NOTE — Progress Notes (Signed)
[facility] Stacy,  ° °Provided pt with a list of primary care resources and a [facility] Orange Card application to help patient establish primary care.  °

## 2014-03-25 NOTE — ED Notes (Signed)
Pt c/o headache since 0630 this morning.  C/o nausea and blurry vision.

## 2014-03-25 NOTE — ED Provider Notes (Signed)
CSN: [id number]     Arrival date & time 03/25/14  0818 History   First MD Initiated Contact with Patient 03/25/14 [phone]     Chief Complaint  Patient presents with  . Headache     (Consider location/radiation/quality/duration/timing/severity/associated sxs/prior Treatment) Patient is a 38 y.o. male presenting with headaches. The history is provided by the patient.  Headache Pain location:  R parietal and R temporal Quality:  Dull Radiates to:  Does not radiate Onset quality:  Gradual Duration:  3 hours Timing:  Constant Progression:  Unchanged Chronicity:  New Similar to prior headaches: no   Context: not loud noise and not straining   Relieved by:  Nothing Worsened by:  Light Ineffective treatments:  None tried Associated symptoms: nausea and vomiting   Associated symptoms: no abdominal pain, no cough and no fever     Past Medical History  Diagnosis Date  . Asthma    Past Surgical History  Procedure Laterality Date  . Orchectomy Left    Family History  Problem Relation Age of Onset  . Hypertension Mother   . Hypertension Father    History  Substance Use Topics  . Smoking status: Current Every Day Smoker -- 0.50 packs/day for 5 years    Types: Cigarettes  . Smokeless tobacco: Never Used  . Alcohol Use: Yes     Comment: socially    Review of Systems  Constitutional: Negative for fever.  Eyes: Positive for visual disturbance (blurry).  Respiratory: Negative for cough and shortness of breath.   Cardiovascular: Negative for chest pain and leg swelling.  Gastrointestinal: Positive for nausea and vomiting. Negative for abdominal pain.  Neurological: Positive for headaches.  All other systems reviewed and are negative.     Allergies  Tramadol; Fish allergy; Tylenol; and Ibuprofen  Home Medications   Prior to Admission medications   Medication Sig Start Date End Date Taking? Authorizing Provider  albuterol (PROVENTIL HFA;VENTOLIN HFA) 108 (90 BASE) MCG/ACT  inhaler Inhale 1-2 puffs into the lungs every 6 (six) hours as needed for wheezing or shortness of breath. 03/24/13  Yes Heather Laisure, PA-C  oxyCODONE (OXY IR/ROXICODONE) 5 MG immediate release tablet Take 10 mg by mouth every 4 (four) hours as needed for severe pain.   Yes Historical Provider, MD  naproxen (NAPROSYN) 500 MG tablet Take 1 tablet (500 mg total) by mouth 2 (two) times daily. 01/17/14   Fayrene HelperBowie Tran, PA-C  oxyCODONE (ROXICODONE) 5 MG immediate release tablet Take 1 tablet (5 mg total) by mouth every 6 (six) hours as needed for severe pain. 12/06/13   Mora BellmanHannah S Merrell, PA-C  penicillin v potassium (VEETID) 500 MG tablet Take 1 tablet (500 mg total) by mouth 3 (three) times daily. 01/17/14   Fayrene HelperBowie Tran, PA-C   BP 130/89 mmHg  Pulse 64  Temp(Src) 98.1 F (36.7 C) (Oral)  Resp 16  SpO2 100% Physical Exam  Constitutional: He is oriented to person, place, and time. He appears well-developed and well-nourished. No distress.  HENT:  Head: Normocephalic and atraumatic.  Mouth/Throat: No oropharyngeal exudate.  Eyes: EOM are normal. Pupils are equal, round, and reactive to light.  Neck: Normal range of motion. Neck supple.  Cardiovascular: Normal rate and regular rhythm.  Exam reveals no friction rub.   No murmur heard. Pulmonary/Chest: Effort normal and breath sounds normal. No respiratory distress. He has no wheezes. He has no rales.  Abdominal: He exhibits no distension. There is no tenderness. There is no rebound.  Musculoskeletal: Normal range  of motion. He exhibits no edema.  Neurological: He is alert and oriented to person, place, and time. No cranial nerve deficit. He exhibits normal muscle tone. Coordination normal.  Skin: He is not diaphoretic.  Nursing note and vitals reviewed.   ED Course  Procedures (including critical care time) Labs Review Labs Reviewed - No data to display  Imaging Review Ct Head Wo Contrast  03/25/2014   CLINICAL DATA:  Initial evaluation for  headache since 06:30 this morning, along with nausea and blurred vision  EXAM: CT HEAD WITHOUT CONTRAST  TECHNIQUE: Contiguous axial images were obtained from the base of the skull through the vertex without intravenous contrast.  COMPARISON:  12/09/2012  FINDINGS: There is no evidence of mass, hemorrhage, extra-axial fluid, or infarct. There is no hydrocephalus. Multiple right ethmoid air cells are opacified to varying degrees, similar to prior study, with this process again extending into the right frontal sinus. Calvarium is intact.  IMPRESSION: Mild chronic sinusitis.  No acute findings.   Electronically Signed   By: Esperanza Heiraymond  Rubner M.D.   On: 03/25/2014 10:04     EKG Interpretation None      MDM   Final diagnoses:  Headache    32M presents with a headache. No hx of headaches. Began this morning about 3 hours ago. Associated nausea and vomiting x2. Mild blurry vision. Sensitive to light. No instigating factors. L sided throbbing, nonradiating. Will treat with migraine cocktail. Without hx of headaches, will scan his head, within 6 hours, so can effectively rule out SAH. CT normal. Headache improved after meds. Stable for discharge, given resource guide for f/u.    Elwin MochaBlair Jacquelynne Guedes, MD 03/25/14 [phone]

## 2014-03-25 NOTE — ED Notes (Signed)
Two unsuccessful IV attempt.

## 2014-03-25 NOTE — Discharge Instructions (Signed)
General Headache Without Cause °A general headache is pain or discomfort felt around the head or neck area. The cause may not be found.  °HOME CARE  °· Keep all doctor visits. °· Only take medicines as told by your doctor. °· Lie down in a dark, quiet room when you have a headache. °· Keep a journal to find out if certain things bring on headaches. For example, write down: °¨ What you eat and drink. °¨ How much sleep you get. °¨ Any change to your diet or medicines. °· Relax by getting a massage or doing other relaxing activities. °· Put ice or heat packs on the head and neck area as told by your doctor. °· Lessen stress. °· Sit up straight. Do not tighten (tense) your muscles. °· Quit smoking if you smoke. °· Lessen how much alcohol you drink. °· Lessen how much caffeine you drink, or stop drinking caffeine. °· Eat and sleep on a regular schedule. °· Get 7 to 9 hours of sleep, or as told by your doctor. °· Keep lights dim if bright lights bother you or make your headaches worse. °GET HELP RIGHT AWAY IF:  °· Your headache becomes really bad. °· You have a fever. °· You have a stiff neck. °· You have trouble seeing. °· Your muscles are weak, or you lose muscle control. °· You lose your balance or have trouble walking. °· You feel like you will pass out (faint), or you pass out. °· You have really bad symptoms that are different than your first symptoms. °· You have problems with the medicines given to you by your doctor. °· Your medicines do not work. °· Your headache feels different than the other headaches. °· You feel sick to your stomach (nauseous) or throw up (vomit). °MAKE SURE YOU:  °· Understand these instructions. °· Will watch your condition. °· Will get help right away if you are not doing well or get worse. °Document Released: 02/07/2008 Document Revised: 07/23/2011 Document Reviewed: 04/20/2011 °ExitCare® Patient Information ©2015 [employer]. This information is not intended to replace advice given to  you by your health care provider. Make sure you discuss any questions you have with your health care provider. ° °Emergency Department Resource Guide °1) Find a Doctor and Pay Out of Pocket °Although you won't have to find out who is covered by your insurance plan, it is a good idea to ask around and get recommendations. You will then need to call the office and see if the doctor you have chosen will accept you as a new patient and what types of options they offer for patients who are self-pay. Some doctors offer discounts or will set up payment plans for their patients who do not have insurance, but you will need to ask so you aren't surprised when you get to your appointment. ° °2) Contact Your Local Health Department °Not all health departments have doctors that can see patients for sick visits, but many do, so it is worth a call to see if yours does. If you don't know where your local health department is, you can check in your phone book. The CDC also has a tool to help you locate your state's health department, and many state websites also have listings of all of their local health departments. ° °3) Find a Walk-in Clinic °If your illness is not likely to be very severe or complicated, you may want to try a walk in clinic. These are popping up all over the   country in pharmacies, drugstores, and shopping centers. They're usually staffed by nurse practitioners or physician assistants that have been trained to treat common illnesses and complaints. They're usually fairly quick and inexpensive. However, if you have serious medical issues or chronic medical problems, these are probably not your best option. ° °No Primary Care Doctor: °- Call Health Connect at  832-8000 - they can help you locate a primary care doctor that  accepts your insurance, provides certain services, etc. °- Physician Referral Service- 1-800-533-3463 ° °Chronic Pain Problems: °Organization         Address  Phone   Notes  °[facility]  (336) 297-2271 Patients need to be referred by their primary care doctor.  ° °Medication Assistance: °Organization         Address  Phone   Notes  °[place] [facility] [address] °[place], [place] [address] (336) 641-8030 --Must be a resident of [place] °-- Must have NO insurance coverage whatsoever (no Medicaid/ Medicare, etc.) °-- The pt. MUST have a primary care doctor that directs their care regularly and follows them in the community °  °[facility]  (866) 331-1348   °[employer]  (888) 892-1162   ° °Agencies that provide inexpensive medical care: °Organization         Address  Phone   Notes  °[facility]  (336) 832-8035   °[facility]    (336) 832-7272   °[facility] [address] °[place], [place] [address] (336) 832-4777   °[facility] [address], °[place] (336) 271-4999   °[facility]    (336) 373-0678   °[facility]    (336) 272-1050   °[facility] ° [address], [place] Phone:  (336) 832-4444, Fax:  (336) 832-4440 Hours of Operation:  9 am - 6 pm, M-F.  Also accepts Medicaid/Medicare and self-pay.  °[facility] ° [address], [place] Phone: (336) 832-3150, Fax: (336) [phone]. Hours of Operation:  8:30 am - 5:30 pm, M-F.  Also accepts Medicaid and self-pay.  °[facility] [place] [address], [place] Phone: (336) 878-6027   °[facility] [address], [place], [place] (336)723-1848, Ext. 123 Mondays & Thursdays: 7-9 AM.  First 15 patients are seen on a first come, first serve basis. °  ° °Medicaid-accepting [place] Providers: ° °Organization         Address  Phone   Notes  °[facility] [address], [place] (336) 641-2100 Also accepts self-pay patients.  °[facility] [address], [place] ° (336) 856-9996   °[facility] [address], [place] (336) 288-8857   °[facility] [address] [place] [address], [place] (336) 299-7000   °Veita Bland [address], [place]  ° (336) 373-1557 Only accepts [place] Access Medicaid patients after they have their name applied to their card.  ° °Self-Pay (no insurance) in [place]: ° °Organization         Address  Phone   Notes  °Sickle Cell Patients, [facility] [address], [place] (336) 832-1970   °[facility] [address],  (336) 832-4400   °[facility] [place] ° [address]   [address], [place] (336) 992-4800   °[facility]/Dr. Osei-Bonsu ° [address] [place] [address], [place] or [address], [place] (336) 841-8500 Phone number for both [place] and [place] locations is the same.  °[facility] [address], [place] (336) 299-0000   °[facility] [address] [place] [address], [place] or [address] (336) 852-7530 °(336) 878-2260   °[facility] [address], [place] (336) 350-1642, phone; (336) 294-5005, fax Sees patients 1st and 3rd Saturday of every month.  Must not qualify for public or private insurance (i.e. Medicaid, Medicare, [place] Health Choice, Veterans' Benefits) • Household income should be no more than 200% of the poverty level •The clinic cannot treat you if you are pregnant or think you are pregnant • Sexually transmitted diseases are not treated at the clinic.  ° ° °Dental Care: °Organization         Address  Phone  Notes  °[facility] [address], [place] (336) 641-6152 Accepts children up to age 21 who are enrolled in Medicaid or [place] Health Choice; pregnant women with a Medicaid card; and children who have applied for Medicaid or [place]  Health Choice, but were declined, whose parents can pay a reduced fee at time of service.  °[facility] [place]  [address], [place] (336) 641-7733 Accepts children up to age 21 who are enrolled in Medicaid or [place] Health Choice; pregnant women with a Medicaid card; and children who have applied for Medicaid or [place] Health Choice, but were declined, whose parents can pay a reduced fee at time of service.  °[facility] ° [address], [place] (336) 641-4533 Patients are seen by appointment only. Walk-ins are not accepted. [facility] will see patients 18 years of age and older. °Monday - Tuesday (8am-5pm) °Most Wednesdays (8:30-5pm) °$30 per visit, cash only  °[facility] ° [address], [place] (336) 641-4533 Patients are seen by appointment only. Walk-ins are not accepted. [facility] will see patients 18 years of age and older. °One Wednesday Evening (Monthly: Volunteer Based).  $30 per visit, cash only  °[facility]  (919) 537-3737 for adults; Children under age 4, call Graduate Pediatric Dentistry at (919) [phone]. Children aged 4-14, please call (919) [phone] to request a pediatric application. ° Dental services are provided in all areas of dental care including fillings, crowns and bridges, complete and partial dentures, implants, gum treatment, root canals, and extractions. Preventive care is also provided. Treatment is provided to both adults and children. °Patients are selected via a lottery and there is often a waiting list. °  °[facility] [address], °[place] ° (336) 763-8833 [email or web address] °  °[facility] [address], [place], [place] (336)723-1848, Ext. 123 Second and Fourth Thursday of each month, opens at 6:30 AM; Clinic ends at 9 AM.  Patients are seen on a first-come first-served basis, and a limited number are seen during  each clinic.  ° °[facility] ° [address], [place], [place] (336) 723-7904   Eligibility Requirements °You must have lived in [place], or [place] counties for at least the last three months. °  You cannot be eligible for state or federal sponsored healthcare insurance, including Veterans Administration, Medicaid, or Medicare. °  You   generally cannot be eligible for healthcare insurance through your employer.  °  How to apply: °Eligibility screenings are held every Tuesday and Wednesday afternoon from 1:00 pm until 4:00 pm. You do not need an appointment for the interview!  °[facility] [address], [place], [place] 336-631-2330   °[facility]  336-342-8273   °[facility]  336-703-3100   °[facility]  336-570-6415   ° °Behavioral Health Resources in the Community: °Intensive Outpatient Programs °Organization         Address  Phone  Notes  °[facility] [address], [place], [place] 336-878-6098   °[facility] [address], [place], [place] 336-832-9800   °[facility] [address], [place], [place] ° 336-882-2125   °[facility] [address],  °[place], [place] 1-800-853-5163 or 336-641-4981   °Substance Abuse Resources °Organization         Address  Phone  Notes  °[facility]  336-882-2125   °[facility]  336-784-9470   °[facility]  336-285-9073   °[facility]  336-845-3988   °Residential & Outpatient Substance Abuse Program  1-800-659-3381   °Psychological Services °Organization         Address  Phone  Notes  ° Health  336- 832-9600   °[facility]  336- 378-7881   °[facility] [address], [place] 1-800-853-5163 or 336-641-4981   ° °Mobile Crisis Teams °Organization         Address  Phone  Notes  °[facility], Mobile  Crisis Care Unit  1-877-626-1772   °Assertive °[facility] ° [address] [place], [place] 336-834-9664   °Sharon DeEsch [address] °[place] 336-554-5454   ° °Self-Help/Support Groups °Organization         Address  Phone             Notes  °[facility] [place] - variety of support groups  336- 373-1402 Call for more information  °Narcotics Anonymous (NA), [facility] [address], °[place]  2 meetings at this location  ° °Residential Treatment Programs °Organization         Address  Phone  Notes  °[facility] [address],    °[place]  1-866-801-8205   °[facility] ° [address], [place], [place] 704-293-8524   °[facility] [address], [place] 336-845-3988 Admissions: 8am-3pm M-F  °[facility] [address]    °[place], [place] 336-841-1104   °[facility] [address], [place], [place] 336-379-7146   °[facility] [address]  °[place], [place] 336-285-9073   °[facility] - Intensive Outpatient [address], [place], [place] 336-852-3033   °[facility] [address]  °[place], [place] 1-877-615-2722 or 336-784-9470   °[facility] [address] [place], [place] 336-227-7417 Accepts Medicaid  °[facility] [address]  ° [place] 1-800-659-3381 Substance Abuse/Addiction Treatment  ° °[place] Behavioral Health Resources °Organization         Address  Phone  Notes  °[facility]  (888) 581-9988   °Julie Brannon, PhD [address] [place], [place]   (336) 349-5553 or (336) 951-0000   °[facility]   [address] °[place], [place] (336) 349-4454   °[facility] [address], [place], [place] (336) 342-8316 Insurance/Medicaid/sponsorship through [facility]  °[facility] [address]                                    [place], [place] (336) 342-8316 Therapy/tele-psych/case  °[facility] [address]  ° [place], [place] (336) 349-2233    °Dr. Arfeen  (336) 349-4544   °[facility] [place]  [employer] [place] [facility] 1) [address], [place] °2) [address], [place] °3)  [address], [place] (336) 349-3220 °(336) 342-7768 ° °(336) 342-8140   °[place] Child Abuse Hotline (336) 342-1394 or (336) 342-3537 (After Hours)    ° ° °

## 2014-04-16 ENCOUNTER — Emergency Department (HOSPITAL_COMMUNITY): Payer: No Typology Code available for payment source

## 2014-04-16 ENCOUNTER — Emergency Department (HOSPITAL_COMMUNITY)
Admission: EM | Admit: 2014-04-16 | Discharge: 2014-04-16 | Disposition: A | Discharge status: Home / Self Care | Payer: No Typology Code available for payment source | Attending: Emergency Medicine | Admitting: Emergency Medicine

## 2014-04-16 ENCOUNTER — Encounter (HOSPITAL_COMMUNITY): Payer: Self-pay | Admitting: Family Medicine

## 2014-04-16 DIAGNOSIS — T148XXA Other injury of unspecified body region, initial encounter: Secondary | ICD-10-CM

## 2014-04-16 DIAGNOSIS — S60221A Contusion of right hand, initial encounter: Secondary | ICD-10-CM | POA: Insufficient documentation

## 2014-04-16 DIAGNOSIS — Y9389 Activity, other specified: Secondary | ICD-10-CM | POA: Insufficient documentation

## 2014-04-16 DIAGNOSIS — W228XXA Striking against or struck by other objects, initial encounter: Secondary | ICD-10-CM | POA: Insufficient documentation

## 2014-04-16 DIAGNOSIS — Z792 Long term (current) use of antibiotics: Secondary | ICD-10-CM | POA: Insufficient documentation

## 2014-04-16 DIAGNOSIS — Z720 Tobacco use: Secondary | ICD-10-CM | POA: Insufficient documentation

## 2014-04-16 DIAGNOSIS — Z79899 Other long term (current) drug therapy: Secondary | ICD-10-CM | POA: Insufficient documentation

## 2014-04-16 DIAGNOSIS — S6991XA Unspecified injury of right wrist, hand and finger(s), initial encounter: Secondary | ICD-10-CM

## 2014-04-16 DIAGNOSIS — Y9289 Other specified places as the place of occurrence of the external cause: Secondary | ICD-10-CM | POA: Insufficient documentation

## 2014-04-16 DIAGNOSIS — Y990 Civilian activity done for income or pay: Secondary | ICD-10-CM | POA: Insufficient documentation

## 2014-04-16 DIAGNOSIS — T1490XA Injury, unspecified, initial encounter: Secondary | ICD-10-CM

## 2014-04-16 DIAGNOSIS — J45909 Unspecified asthma, uncomplicated: Secondary | ICD-10-CM | POA: Insufficient documentation

## 2014-04-16 DIAGNOSIS — Z791 Long term (current) use of non-steroidal anti-inflammatories (NSAID): Secondary | ICD-10-CM | POA: Insufficient documentation

## 2014-04-16 MED ORDER — OXYCODONE HCL 5 MG PO TABS: 5.0000 mg | ORAL_TABLET | Freq: Four times a day (QID) | ORAL | Status: DC | PRN

## 2014-04-16 MED ORDER — HYDROCODONE-ACETAMINOPHEN 5-325 MG PO TABS
1.0000 | ORAL_TABLET | Freq: Once | ORAL | Status: AC
  Administered 2014-04-16: 1 via ORAL

## 2014-04-16 MED ORDER — ONDANSETRON 4 MG PO TBDP
4.0000 mg | ORAL_TABLET | Freq: Once | ORAL | Status: AC
  Administered 2014-04-16: 4 mg via ORAL

## 2014-04-16 MED ORDER — NAPROXEN 500 MG PO TABS: 500.0000 mg | ORAL_TABLET | Freq: Two times a day (BID) | ORAL | Status: DC

## 2014-04-16 NOTE — ED Provider Notes (Signed)
CSN: [id number]     Arrival date & time 04/16/14  1304 History  This chart was scribed for Arthor CaptainAbigail Jeremiyah Cullens, PA-C, working with Doug SouSam Jacubowitz, MD by Chestine SporeSoijett Blue, ED Scribe. The patient was seen in room TR06C/TR06C at 2:12 PM.    Chief Complaint  Patient presents with  . Hand Injury    The history is provided by the patient. No language interpreter was used.   HPI Comments: Mark Cruz is a 38 y.o. male who presents to the Emergency Department complaining of hand pain onset 12 PM PTA. He slammed his hand in a metal door at work. He states that he is having associated symptoms of joint swelling. He denies any other symptoms.  Past Medical History  Diagnosis Date  . Asthma    Past Surgical History  Procedure Laterality Date  . Orchectomy Left    Family History  Problem Relation Age of Onset  . Hypertension Mother   . Hypertension Father    History  Substance Use Topics  . Smoking status: Current Every Day Smoker -- 0.50 packs/day for 5 years    Types: Cigarettes  . Smokeless tobacco: Never Used  . Alcohol Use: Yes     Comment: socially    Review of Systems  Musculoskeletal: Positive for joint swelling and arthralgias.    Allergies  Tramadol; Fish allergy; Tylenol; and Ibuprofen  Home Medications   Prior to Admission medications   Medication Sig Start Date End Date Taking? Authorizing Provider  albuterol (PROVENTIL HFA;VENTOLIN HFA) 108 (90 BASE) MCG/ACT inhaler Inhale 1-2 puffs into the lungs every 6 (six) hours as needed for wheezing or shortness of breath. 03/24/13   Heather Laisure, PA-C  naproxen (NAPROSYN) 500 MG tablet Take 1 tablet (500 mg total) by mouth 2 (two) times daily. 01/17/14   Fayrene HelperBowie Tran, PA-C  oxyCODONE (OXY IR/ROXICODONE) 5 MG immediate release tablet Take 10 mg by mouth every 4 (four) hours as needed for severe pain.    Historical Provider, MD  oxyCODONE (ROXICODONE) 5 MG immediate release tablet Take 1 tablet (5 mg total) by mouth every 6  (six) hours as needed for severe pain. 12/06/13   Mora BellmanHannah S Merrell, PA-C  penicillin v potassium (VEETID) 500 MG tablet Take 1 tablet (500 mg total) by mouth 3 (three) times daily. 01/17/14   Fayrene HelperBowie Tran, PA-C   BP 131/78 mmHg  Pulse 69  Temp(Src) 97.6 F (36.4 C)  Resp 18  SpO2 98%  Physical Exam  Constitutional: He is oriented to person, place, and time. He appears well-developed and well-nourished. No distress.  HENT:  Head: Normocephalic and atraumatic.  Eyes: EOM are normal.  Neck: Neck supple. No tracheal deviation present.  Cardiovascular: Normal rate.   Pulmonary/Chest: Effort normal. No respiratory distress.  Musculoskeletal: Normal range of motion.       Right hand: He exhibits swelling. He exhibits normal range of motion. Normal sensation noted.  Significant swelling over the fifth metacarpal. Able to move pinky and has full ROM except for opposition from the pinky to the thumb due to pain. Pulses intact. Sensations intact.   Neurological: He is alert and oriented to person, place, and time.  Skin: Skin is warm and dry.  Psychiatric: He has a normal mood and affect. His behavior is normal.  Nursing note and vitals reviewed.   ED Course  Procedures (including critical care time) DIAGNOSTIC STUDIES: Oxygen Saturation is 98% on room air, normal by my interpretation.    COORDINATION OF CARE: 2:15  PM-Discussed treatment plan which includes ice pack, right hand X-ray with pt at bedside and pt agreed to plan.   Labs Review Labs Reviewed - No data to display  Imaging Review Dg Hand Complete Right  04/16/2014   CLINICAL DATA:  Crush injury right hand with pain, bruising and swelling. Initial encounter.  EXAM: RIGHT HAND - COMPLETE 3+ VIEW  COMPARISON:  None.  FINDINGS: Imaged bones, joints and soft tissues appear normal.  IMPRESSION: Negative exam.   Electronically Signed   By: Drusilla Kannerhomas  Dalessio M.D.   On: 04/16/2014 14:14     EKG Interpretation None      MDM   Final  diagnoses:  Hand injury, right, initial encounter  Contusion    Patient X-Ray negative for obvious fracture or dislocation. Pain managed in ED. Pt advised to follow up with orthopedics if symptoms persist for possibility of missed fracture diagnosis. Patient given brace while in ED, conservative therapy recommended and discussed. Patient will be dc home & is agreeable with above plan.   I personally performed the services described in this documentation, which was scribed in my presence. The recorded information has been reviewed and is accurate.    Arthor Captainbigail Clark Cuff, PA-C 04/17/14 1218  Doug SouSam Jacubowitz, MD 04/17/14 [phone]

## 2014-04-16 NOTE — ED Notes (Signed)
Pt sts he slammed his hand in a door at work/. Pain with movement ans slight swelling.

## 2014-05-15 ENCOUNTER — Encounter (HOSPITAL_COMMUNITY): Payer: Self-pay | Admitting: *Deleted

## 2014-05-15 ENCOUNTER — Emergency Department (HOSPITAL_COMMUNITY): Payer: No Typology Code available for payment source

## 2014-05-15 ENCOUNTER — Emergency Department (HOSPITAL_COMMUNITY)
Admission: EM | Admit: 2014-05-15 | Discharge: 2014-05-15 | Disposition: A | Discharge status: Home / Self Care | Payer: No Typology Code available for payment source | Attending: Emergency Medicine | Admitting: Emergency Medicine

## 2014-05-15 DIAGNOSIS — S6991XA Unspecified injury of right wrist, hand and finger(s), initial encounter: Secondary | ICD-10-CM | POA: Insufficient documentation

## 2014-05-15 DIAGNOSIS — Y998 Other external cause status: Secondary | ICD-10-CM | POA: Insufficient documentation

## 2014-05-15 DIAGNOSIS — Z791 Long term (current) use of non-steroidal anti-inflammatories (NSAID): Secondary | ICD-10-CM | POA: Insufficient documentation

## 2014-05-15 DIAGNOSIS — Z720 Tobacco use: Secondary | ICD-10-CM | POA: Insufficient documentation

## 2014-05-15 DIAGNOSIS — M79641 Pain in right hand: Secondary | ICD-10-CM

## 2014-05-15 DIAGNOSIS — Z792 Long term (current) use of antibiotics: Secondary | ICD-10-CM | POA: Insufficient documentation

## 2014-05-15 DIAGNOSIS — Y9389 Activity, other specified: Secondary | ICD-10-CM | POA: Insufficient documentation

## 2014-05-15 DIAGNOSIS — Y9289 Other specified places as the place of occurrence of the external cause: Secondary | ICD-10-CM | POA: Insufficient documentation

## 2014-05-15 DIAGNOSIS — Z79899 Other long term (current) drug therapy: Secondary | ICD-10-CM | POA: Insufficient documentation

## 2014-05-15 DIAGNOSIS — J45901 Unspecified asthma with (acute) exacerbation: Secondary | ICD-10-CM | POA: Insufficient documentation

## 2014-05-15 MED ORDER — IBUPROFEN 800 MG PO TABS: 800.0000 mg | ORAL_TABLET | Freq: Three times a day (TID) | ORAL | Status: DC

## 2014-05-15 MED ORDER — IBUPROFEN 400 MG PO TABS
800.0000 mg | ORAL_TABLET | Freq: Three times a day (TID) | ORAL | Status: DC
  Administered 2014-05-15: 800 mg via ORAL
  Filled 2014-05-15: qty 2

## 2014-05-15 NOTE — ED Notes (Signed)
Patient transported to X-ray 
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
## 2014-05-15 NOTE — ED Notes (Signed)
Declined W/C at D/C and was escorted to lobby by RN. 
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
## 2014-05-15 NOTE — Discharge Instructions (Signed)
It is important to follow up with primary care or [facility] for further evaluation and management of your symptoms. If your symptoms do not improve please return to ED for further evaluation. Please take her pain medicine as prescribed.

## 2014-08-05 ENCOUNTER — Emergency Department (HOSPITAL_COMMUNITY)
Admission: EM | Admit: 2014-08-05 | Discharge: 2014-08-05 | Disposition: A | Discharge status: Home / Self Care | Payer: Worker's Compensation | Attending: Emergency Medicine | Admitting: Emergency Medicine

## 2014-08-05 ENCOUNTER — Encounter (HOSPITAL_COMMUNITY): Payer: Self-pay | Admitting: Neurology

## 2014-08-05 ENCOUNTER — Emergency Department (HOSPITAL_COMMUNITY): Payer: Worker's Compensation

## 2014-08-05 DIAGNOSIS — Z720 Tobacco use: Secondary | ICD-10-CM | POA: Insufficient documentation

## 2014-08-05 DIAGNOSIS — Y990 Civilian activity done for income or pay: Secondary | ICD-10-CM | POA: Insufficient documentation

## 2014-08-05 DIAGNOSIS — Z792 Long term (current) use of antibiotics: Secondary | ICD-10-CM | POA: Insufficient documentation

## 2014-08-05 DIAGNOSIS — Y9289 Other specified places as the place of occurrence of the external cause: Secondary | ICD-10-CM | POA: Insufficient documentation

## 2014-08-05 DIAGNOSIS — Y9389 Activity, other specified: Secondary | ICD-10-CM | POA: Insufficient documentation

## 2014-08-05 DIAGNOSIS — W208XXA Other cause of strike by thrown, projected or falling object, initial encounter: Secondary | ICD-10-CM | POA: Insufficient documentation

## 2014-08-05 DIAGNOSIS — J45909 Unspecified asthma, uncomplicated: Secondary | ICD-10-CM | POA: Insufficient documentation

## 2014-08-05 DIAGNOSIS — Z791 Long term (current) use of non-steroidal anti-inflammatories (NSAID): Secondary | ICD-10-CM | POA: Insufficient documentation

## 2014-08-05 DIAGNOSIS — S60211A Contusion of right wrist, initial encounter: Secondary | ICD-10-CM | POA: Insufficient documentation

## 2014-08-05 MED ORDER — NAPROXEN 500 MG PO TABS: 500.0000 mg | ORAL_TABLET | Freq: Two times a day (BID) | ORAL | Status: DC

## 2014-08-05 MED ORDER — NAPROXEN 250 MG PO TABS
500.0000 mg | ORAL_TABLET | Freq: Once | ORAL | Status: AC
  Administered 2014-08-05: 500 mg via ORAL
  Filled 2014-08-05: qty 2

## 2014-08-05 NOTE — Discharge Instructions (Signed)
Please wear wrist splint as needed for support.  Follow instruction below.   Contusion A contusion is the result of an injury to the skin and underlying tissues and is usually caused by direct trauma. The injury results in the appearance of a bruise on the skin overlying the injured tissues. Contusions cause rupture and bleeding of the small capillaries and blood vessels and affect function, because the bleeding infiltrates muscles, tendons, nerves, or other soft tissues.  SYMPTOMS   Swelling and often a hard lump in the injured area, either superficial or deep.  Pain and tenderness over the area of the contusion.  Feeling of firmness when pressure is exerted over the contusion.  Discoloration under the skin, beginning with redness and progressing to the characteristic "black and blue" bruise. CAUSES  A contusion is typically the result of direct trauma. This is often by a blunt object.  RISK INCREASES WITH:  Sports that have a high likelihood of trauma (football, boxing, ice hockey, soccer, field hockey, martial arts, basketball, and baseball).  Sports that make falling from a height likely (high-jumping, pole-vaulting, skating, or gymnastics).  Any bleeding disorder (hemophilia) or taking medications that affect clotting (aspirin, nonsteroidal anti-inflammatory medications, or warfarin [Coumadin]).  Inadequate protection of exposed areas during contact sports. PREVENTION  Maintain physical fitness:  Joint and muscle flexibility.  Strength and endurance.  Coordination.  Wear proper protective equipment. Make sure it fits correctly. PROGNOSIS  Contusions typically heal without any complications. Healing time varies with the severity of injury and intake of medications that affect clotting. Contusions usually heal in 1 to 4 weeks. RELATED COMPLICATIONS   Damage to nearby nerves or blood vessels, causing numbness, coldness, or paleness.  Compartment syndrome.  Bleeding into  the soft tissues that leads to disability.  Infiltrative-type bleeding, leading to the calcification and impaired function of the injured muscle (rare).  Prolonged healing time if usual activities are resumed too soon.  Infection if the skin over the injury site is broken.  Fracture of the bone underlying the contusion.  Stiffness in the joint where the injured muscle crosses. TREATMENT  Treatment initially consists of resting the injured area as well as medication and ice to reduce inflammation. The use of a compression bandage may also be helpful in minimizing inflammation. As pain diminishes and movement is tolerated, the joint where the affected muscle crosses should be moved to prevent stiffness and the shortening (contracture) of the joint. Movement of the joint should begin as soon as possible. It is also important to work on maintaining strength within the affected muscles. Occasionally, extra padding over the area of contusion may be recommended before returning to sports, particularly if re-injury is likely.  MEDICATION   If pain relief is necessary these medications are often recommended:  Nonsteroidal anti-inflammatory medications, such as aspirin and ibuprofen.  Other minor pain relievers, such as acetaminophen, are often recommended.  Prescription pain relievers may be given by your caregiver. Use only as directed and only as much as you need. HEAT AND COLD  Cold treatment (icing) relieves pain and reduces inflammation. Cold treatment should be applied for 10 to 15 minutes every 2 to 3 hours for inflammation and pain and immediately after any activity that aggravates your symptoms. Use ice packs or an ice massage. (To do an ice massage fill a large styrofoam cup with water and freeze. Tear a small amount of foam from the top so ice protrudes. Massage ice firmly over the injured area in a circle  about the size of a softball.)  Heat treatment may be used prior to performing the  stretching and strengthening activities prescribed by your caregiver, physical therapist, or athletic trainer. Use a heat pack or a warm soak. SEEK MEDICAL CARE IF:   Symptoms get worse or do not improve despite treatment in a few days.  You have difficulty moving a joint.  Any extremity becomes extremely painful, numb, pale, or cool (This is an emergency!).  Medication produces any side effects (bleeding, upset stomach, or allergic reaction).  Signs of infection (drainage from skin, headache, muscle aches, dizziness, fever, or general ill feeling) occur if skin was broken. Document Released: 04/30/2005 Document Revised: 07/23/2011 Document Reviewed: 08/12/2008 [facility] Patient Information 2015 [place], [place]. This information is not intended to replace advice given to you by your health care provider. Make sure you discuss any questions you have with your health care provider.

## 2014-08-05 NOTE — ED Provider Notes (Signed)
CSN: [id number]     Arrival date & time 08/05/14  1459 History  This chart was scribed for non-physician practitioner, Fayrene HelperBowie Inaaya Vellucci, PA-C working with Lorre NickAnthony Allen, MD by Angelene GiovanniEmmanuella Mensah, ED Scribe. The patient was seen in room TR05C/TR05C and the patient's care was started at 4:05 PM    Chief Complaint  Patient presents with  . Wrist Pain   The history is provided by the patient. No language interpreter was used.   HPI Comments: Mark Cruz is a 39 y.o. male who presents to the Emergency Department status post wrist injury that occurred about 1-2 hours ago. He explains that he was at work where he fell and the tool box he was holding fell on his right wrist. He reports an associated throbbing 8/10 right wrist pain that radiates up his right arm. He denies any numbness or tingling in his finger. No complaints of R hand or R elbow pain.  No other injury.  He also denies taking any medication PTA. Pt is Left hand dominant.    Past Medical History  Diagnosis Date  . Asthma    Past Surgical History  Procedure Laterality Date  . Orchectomy Left    Family History  Problem Relation Age of Onset  . Hypertension Mother   . Hypertension Father    History  Substance Use Topics  . Smoking status: Current Every Day Smoker -- 0.50 packs/day for 5 years    Types: Cigarettes  . Smokeless tobacco: Never Used  . Alcohol Use: Yes     Comment: socially    Review of Systems  Constitutional: Negative for fever and chills.  Musculoskeletal: Positive for arthralgias (right wrist). Negative for joint swelling.  Skin: Negative for color change.  Neurological: Negative for weakness and numbness.      Allergies  Tramadol; Fish allergy; and Tylenol  Home Medications   Prior to Admission medications   Medication Sig Start Date End Date Taking? Authorizing Provider  albuterol (PROVENTIL HFA;VENTOLIN HFA) 108 (90 BASE) MCG/ACT inhaler Inhale 1-2 puffs into the lungs every 6 (six)  hours as needed for wheezing or shortness of breath. 03/24/13   Heather Laisure, PA-C  ibuprofen (ADVIL,MOTRIN) 800 MG tablet Take 1 tablet (800 mg total) by mouth 3 (three) times daily. 05/15/14   Joycie PeekBenjamin Cartner, PA-C  naproxen (NAPROSYN) 500 MG tablet Take 1 tablet (500 mg total) by mouth 2 (two) times daily. 04/16/14   Arthor CaptainAbigail Harris, PA-C  oxyCODONE (ROXICODONE) 5 MG immediate release tablet Take 1 tablet (5 mg total) by mouth every 6 (six) hours as needed for severe pain. 04/16/14   Arthor CaptainAbigail Harris, PA-C  penicillin v potassium (VEETID) 500 MG tablet Take 1 tablet (500 mg total) by mouth 3 (three) times daily. 01/17/14   Fayrene HelperBowie Jrake Rodriquez, PA-C   BP 132/86 mmHg  Pulse 72  Temp(Src) 99.1 F (37.3 C) (Oral)  Resp 20  SpO2 100% Physical Exam  Constitutional: He is oriented to person, place, and time. He appears well-developed and well-nourished. No distress.  HENT:  Head: Normocephalic and atraumatic.  Eyes: Conjunctivae and EOM are normal.  Neck: Neck supple. No tracheal deviation present.  Cardiovascular: Normal rate.   Pulmonary/Chest: Effort normal. No respiratory distress.  Musculoskeletal: Normal range of motion.  Right wrist: Point tenderness to ulnar aspect of wrist with moderate swelling. Decreased wrist flexion extension, supination and pronation 2/2 pain.   Sensation intact in all fingers with brisk cap refill.  No Tenderness at the anatomical snuff box.  Right  elbow non tender.   Neurological: He is alert and oriented to person, place, and time.  Skin: Skin is warm and dry.  Psychiatric: He has a normal mood and affect. His behavior is normal.  Nursing note and vitals reviewed.   ED Course  Procedures (including critical care time) DIAGNOSTIC STUDIES: Oxygen Saturation is 100% on RA, normal by my interpretation.    COORDINATION OF CARE: 4:09 PM- Pt with mechanical injury of R wrist when tool box fell over.  Has point tenderness to ulnar aspect of R wrist.  Will obtain xray to  r/o fracture.  Pt advised of plan for treatment and pt agrees.     4:37 PM Xray neg for acute fx.  Wrist splint provided for comfort.  RICE therapy discussed.  Ortho referrral given as needed.    Labs Review Labs Reviewed - No data to display  Imaging Review Dg Wrist Complete Right  08/05/2014   CLINICAL DATA:  Wrist pain after tool box fell on wrist. Initial encounter.  EXAM: RIGHT WRIST - COMPLETE 3+ VIEW  COMPARISON:  None.  FINDINGS: There is no evidence of fracture or dislocation.  Mild spurring at the distal radial ulnar joint.  Chronic appearing erosion to the neck of the fifth metatarsal.  IMPRESSION: No acute osseous findings.   Electronically Signed   By: Marnee Spring M.D.   On: 08/05/2014 16:07     EKG Interpretation None      MDM   Final diagnoses:  Wrist contusion, right, initial encounter   BP 132/86 mmHg  Pulse 72  Temp(Src) 99.1 F (37.3 C) (Oral)  Resp 20  SpO2 100%  I have reviewed nursing notes and vital signs. I personally reviewed the imaging tests through PACS system  I reviewed available ER/hospitalization records thought the EMR   I personally performed the services described in this documentation, which was scribed in my presence. The recorded information has been reviewed and is accurate.    Fayrene Helper, PA-C 08/05/14 1638  Lorre Nick, MD 08/05/14 [phone]

## 2014-08-05 NOTE — ED Notes (Signed)
Pt reports tool box slipped onto this right wrist/arm when carrying it at work. C/o right wrist pain. Strong radial pulse.

## 2015-01-01 ENCOUNTER — Encounter (HOSPITAL_COMMUNITY): Payer: Self-pay

## 2015-01-01 ENCOUNTER — Emergency Department (HOSPITAL_COMMUNITY)
Admission: EM | Admit: 2015-01-01 | Discharge: 2015-01-01 | Disposition: A | Discharge status: Home / Self Care | Payer: Self-pay | Attending: Emergency Medicine | Admitting: Emergency Medicine

## 2015-01-01 DIAGNOSIS — J45901 Unspecified asthma with (acute) exacerbation: Secondary | ICD-10-CM | POA: Insufficient documentation

## 2015-01-01 DIAGNOSIS — Z720 Tobacco use: Secondary | ICD-10-CM | POA: Insufficient documentation

## 2015-01-01 DIAGNOSIS — Z791 Long term (current) use of non-steroidal anti-inflammatories (NSAID): Secondary | ICD-10-CM | POA: Insufficient documentation

## 2015-01-01 DIAGNOSIS — Z792 Long term (current) use of antibiotics: Secondary | ICD-10-CM | POA: Insufficient documentation

## 2015-01-01 MED ORDER — PREDNISONE 20 MG PO TABS: 40.0000 mg | ORAL_TABLET | Freq: Every day | ORAL | Status: DC

## 2015-01-01 MED ORDER — ALBUTEROL SULFATE HFA 108 (90 BASE) MCG/ACT IN AERS: 2.0000 | INHALATION_SPRAY | Freq: Four times a day (QID) | RESPIRATORY_TRACT | Status: DC | PRN

## 2015-01-01 MED ORDER — PREDNISONE 20 MG PO TABS
60.0000 mg | ORAL_TABLET | Freq: Once | ORAL | Status: AC
  Administered 2015-01-01: 60 mg via ORAL
  Filled 2015-01-01: qty 3

## 2015-01-01 MED ORDER — IPRATROPIUM-ALBUTEROL 0.5-2.5 (3) MG/3ML IN SOLN
3.0000 mL | Freq: Once | RESPIRATORY_TRACT | Status: AC
  Administered 2015-01-01: 3 mL via RESPIRATORY_TRACT
  Filled 2015-01-01: qty 3

## 2015-01-01 NOTE — Discharge Instructions (Signed)
Please follow up with your primary care physician in 1-2 days. If you do not have one please call the [facility] number listed above. Please read all discharge instructions and return precautions.  ° ° °Asthma °Asthma is a recurring condition in which the airways tighten and narrow. Asthma can make it difficult to breathe. It can cause coughing, wheezing, and shortness of breath. Asthma episodes, also called asthma attacks, range from minor to life-threatening. Asthma cannot be cured, but medicines and lifestyle changes can help control it. °CAUSES °Asthma is believed to be caused by inherited (genetic) and environmental factors, but its exact cause is unknown. Asthma may be triggered by allergens, lung infections, or irritants in the air. Asthma triggers are different for each person. Common triggers include:  °· Animal dander. °· Dust mites. °· Cockroaches. °· Pollen from trees or grass. °· Mold. °· Smoke. °· Air pollutants such as dust, household cleaners, hair sprays, aerosol sprays, paint fumes, strong chemicals, or strong odors. °· Cold air, weather changes, and winds (which increase molds and pollens in the air). °· Strong emotional expressions such as crying or laughing hard. °· Stress. °· Certain medicines (such as aspirin) or types of drugs (such as beta-blockers). °· Sulfites in foods and drinks. Foods and drinks that may contain sulfites include dried fruit, potato chips, and sparkling grape juice. °· Infections or inflammatory conditions such as the flu, a cold, or an inflammation of the nasal membranes (rhinitis). °· Gastroesophageal reflux disease (GERD). °· Exercise or strenuous activity. °SYMPTOMS °Symptoms may occur immediately after asthma is triggered or many hours later. Symptoms include: °· Wheezing. °· Excessive nighttime or early morning coughing. °· Frequent or severe coughing with a common cold. °· Chest tightness. °· Shortness of breath. °DIAGNOSIS  °The diagnosis of  asthma is made by a review of your medical history and a physical exam. Tests may also be performed. These may include: °· Lung function studies. These tests show how much air you breathe in and out. °· Allergy tests. °· Imaging tests such as X-rays. °TREATMENT  °Asthma cannot be cured, but it can usually be controlled. Treatment involves identifying and avoiding your asthma triggers. It also involves medicines. There are 2 classes of medicine used for asthma treatment:  °· Controller medicines. These prevent asthma symptoms from occurring. They are usually taken every day. °· Reliever or rescue medicines. These quickly relieve asthma symptoms. They are used as needed and provide short-term relief. °Your health care provider will help you create an asthma action plan. An asthma action plan is a written plan for managing and treating your asthma attacks. It includes a list of your asthma triggers and how they may be avoided. It also includes information on when medicines should be taken and when their dosage should be changed. An action plan may also involve the use of a device called a peak flow meter. A peak flow meter measures how well the lungs are working. It helps you monitor your condition. °HOME CARE INSTRUCTIONS  °· Take medicines only as directed by your health care provider. Speak with your health care provider if you have questions about how or when to take the medicines. °· Use a peak flow meter as directed by your health care provider. Record and keep track of readings. °· Understand and use the action plan to help minimize or stop an asthma attack without needing to seek medical care. °· Control your home environment in the following ways to help prevent   asthma attacks: °¨ Do not smoke. Avoid being exposed to secondhand smoke. °¨ Change your heating and air conditioning filter regularly. °¨ Limit your use of fireplaces and wood stoves. °¨ Get rid of pests (such as roaches and mice) and their  droppings. °¨ Throw away plants if you see mold on them. °¨ Clean your floors and dust regularly. Use unscented cleaning products. °¨ Try to have someone else vacuum for you regularly. Stay out of rooms while they are being vacuumed and for a short while afterward. If you vacuum, use a dust mask from a hardware store, a double-layered or microfilter vacuum cleaner bag, or a vacuum cleaner with a HEPA filter. °¨ Replace carpet with wood, tile, or vinyl flooring. Carpet can trap dander and dust. °¨ Use allergy-proof pillows, mattress covers, and box spring covers. °¨ Wash bed sheets and blankets every week in hot water and dry them in a dryer. °¨ Use blankets that are made of polyester or cotton. °¨ Clean bathrooms and kitchens with bleach. If possible, have someone repaint the walls in these rooms with mold-resistant paint. Keep out of the rooms that are being cleaned and painted. °¨ Wash hands frequently. °SEEK MEDICAL CARE IF:  °· You have wheezing, shortness of breath, or a cough even if taking medicine to prevent attacks. °· The colored mucus you cough up (sputum) is thicker than usual. °· Your sputum changes from clear or white to yellow, green, gray, or bloody. °· You have any problems that may be related to the medicines you are taking (such as a rash, itching, swelling, or trouble breathing). °· You are using a reliever medicine more than 2-3 times per week. °· Your peak flow is still at 50-79% of your personal best after following your action plan for 1 hour. °· You have a fever. °SEEK IMMEDIATE MEDICAL CARE IF:  °· You seem to be getting worse and are unresponsive to treatment during an asthma attack. °· You are short of breath even at rest. °· You get short of breath when doing very little physical activity. °· You have difficulty eating, drinking, or talking due to asthma symptoms. °· You develop chest pain. °· You develop a fast heartbeat. °· You have a bluish color to your lips or fingernails. °· You  are light-headed, dizzy, or faint. °· Your peak flow is less than 50% of your personal best. °MAKE SURE YOU:  °· Understand these instructions. °· Will watch your condition. °· Will get help right away if you are not doing well or get worse. °Document Released: 04/30/2005 Document Revised: 09/14/2013 Document Reviewed: 11/27/2012 °[employer]® Patient Information ©2015 [employer]. This information is not intended to replace advice given to you by your health care provider. Make sure you discuss any questions you have with your health care provider. ° °

## 2015-01-01 NOTE — ED Notes (Signed)
Pt reports onset of asthma attack this morning with some tightness with breathing. Pt respirations even, unlabored at this time.

## 2015-01-01 NOTE — ED Notes (Signed)
Pulse ox 96-98% while ambulating.

## 2015-01-01 NOTE — ED Notes (Signed)
Up to BR to void.  Tolerated activity well.

## 2015-01-01 NOTE — ED Provider Notes (Signed)
CSN: [id number]     Arrival date & time 01/01/15  0816 History   First MD Initiated Contact with Patient 01/01/15 0827     Chief Complaint  Patient presents with  . Asthma     (Consider location/radiation/quality/duration/timing/severity/associated sxs/prior Treatment) HPI Comments: Pt reports onset of asthma attack this morning with some tightness with breathing.   Patient is a 39 y.o. male presenting with asthma and wheezing.  Asthma Pertinent negatives include no coughing, fever, rash or sore throat.  Wheezing Severity:  Moderate Severity compared to prior episodes:  Similar Onset quality:  Sudden Timing:  Constant Chronicity:  Recurrent Relieved by:  None tried Worsened by:  Nothing tried Ineffective treatments:  None tried Associated symptoms: chest tightness   Associated symptoms: no cough, no fever, no rash, no rhinorrhea and no sore throat   Risk factors: no prior hospitalizations, no prior ICU admissions and no prior intubations     Past Medical History  Diagnosis Date  . Asthma    Past Surgical History  Procedure Laterality Date  . Orchectomy Left    Family History  Problem Relation Age of Onset  . Hypertension Mother   . Hypertension Father    Social History  Substance Use Topics  . Smoking status: Current Every Day Smoker -- 0.50 packs/day for 5 years    Types: Cigarettes  . Smokeless tobacco: Never Used  . Alcohol Use: Yes     Comment: socially    Review of Systems  Constitutional: Negative for fever.  HENT: Negative for rhinorrhea and sore throat.   Respiratory: Positive for chest tightness and wheezing. Negative for cough.   Skin: Negative for rash.  All other systems reviewed and are negative.     Allergies  Tramadol; Fish allergy; and Tylenol  Home Medications   Prior to Admission medications   Medication Sig Start Date End Date Taking? Authorizing Provider  albuterol (PROVENTIL HFA;VENTOLIN HFA) 108 (90 BASE) MCG/ACT inhaler Inhale  1-2 puffs into the lungs every 6 (six) hours as needed for wheezing or shortness of breath. 03/24/13   Heather Laisure, PA-C  albuterol (PROVENTIL HFA;VENTOLIN HFA) 108 (90 BASE) MCG/ACT inhaler Inhale 2 puffs into the lungs every 6 (six) hours as needed for wheezing or shortness of breath. 01/01/15   Francee Piccolo, PA-C  ibuprofen (ADVIL,MOTRIN) 800 MG tablet Take 1 tablet (800 mg total) by mouth 3 (three) times daily. 05/15/14   Joycie Peek, PA-C  naproxen (NAPROSYN) 500 MG tablet Take 1 tablet (500 mg total) by mouth 2 (two) times daily. 08/05/14   Fayrene Helper, PA-C  oxyCODONE (ROXICODONE) 5 MG immediate release tablet Take 1 tablet (5 mg total) by mouth every 6 (six) hours as needed for severe pain. 04/16/14   Arthor Captain, PA-C  penicillin v potassium (VEETID) 500 MG tablet Take 1 tablet (500 mg total) by mouth 3 (three) times daily. 01/17/14   Fayrene Helper, PA-C  predniSONE (DELTASONE) 20 MG tablet Take 2 tablets (40 mg total) by mouth daily. 01/01/15   Marigold Mom, PA-C   BP 127/84 mmHg  Pulse 68  Temp(Src) 98.7 F (37.1 C) (Oral)  Resp 20  SpO2 100% Physical Exam  Constitutional: He is oriented to person, place, and time. He appears well-developed and well-nourished. No distress.  HENT:  Head: Normocephalic and atraumatic.  Right Ear: External ear normal.  Left Ear: External ear normal.  Mouth/Throat: Oropharynx is clear and moist.  Eyes: Conjunctivae are normal.  Neck: Neck supple.  Cardiovascular: Normal rate, regular  rhythm and normal heart sounds.   Pulmonary/Chest: Effort normal. No accessory muscle usage. No respiratory distress. He has wheezes (inspiratory and expiratory). He exhibits no tenderness.  Abdominal: Soft. There is no tenderness.  Musculoskeletal: Normal range of motion.  Neurological: He is alert and oriented to person, place, and time.  Skin: Skin is warm and dry. He is not diaphoretic.  Nursing note and vitals reviewed.   ED Course  Procedures  (including critical care time) Medications  ipratropium-albuterol (DUONEB) 0.5-2.5 (3) MG/3ML nebulizer solution 3 mL (3 mLs Nebulization Given 01/01/15 0852)  predniSONE (DELTASONE) tablet 60 mg (60 mg Oral Given 01/01/15 0852)  ipratropium-albuterol (DUONEB) 0.5-2.5 (3) MG/3ML nebulizer solution 3 mL (3 mLs Nebulization Given 01/01/15 1000)  ipratropium-albuterol (DUONEB) 0.5-2.5 (3) MG/3ML nebulizer solution 3 mL (3 mLs Nebulization Given 01/01/15 1025)    Labs Review Labs Reviewed - No data to display  Imaging Review No results found. I have personally reviewed and evaluated these images and lab results as part of my medical decision-making.   EKG Interpretation None      10:21 AM on reevaluation patient no respiratory distress. No tachypnea or accessory muscle use. Expiratory wheeze still noted. Expiratory phase still mildly prolonged. We'll dose with 1 more DuoNeb that ambulate his pulse ox to ensure no desaturation.   MDM   Final diagnoses:  Asthma exacerbation    Filed Vitals:   01/01/15 1115  BP: 127/84  Pulse: 68  Temp:   Resp:    Afebrile, NAD, non-toxic appearing, AAOx4.   Patient presenting to the ED with asthma exacerbation. Pt alert, active, and oriented per age. PE showed inspiratory and expiratory wheezing without accessory muscle use or retractions. No respiratory distress. Three duonebs and prednisone given in the ED with improvement. Oxygen saturations maintained above 92% in the ED. Able to ambulate mainating O2 sats above 92%. No evidence of respiratory distress, hypoxia, retractions, or accessory muscle use on re-evaluation. No indication for admission at this time. Will discharge patient home with inhaler and prednisone x 4 days. Return precautions discussed. Parent agreeable to plan. Patient is stable at time of discharge      Francee Piccolo, PA-C 01/01/15 1257  Glynn Octave, MD 01/01/15 1341

## 2016-03-28 IMAGING — CT CT HEAD W/O CM
2 series · 16 of 30 positions shown, 20 images · non-contrast
Comparison: 12/09/2012

CLINICAL DATA: Initial evaluation for headache since [DATE] this
morning, along with nausea and blurred vision

EXAM:
CT HEAD WITHOUT CONTRAST
TECHNIQUE: Contiguous axial images were obtained from the base of the skull
through the vertex without intravenous contrast.

[Series 2: head w/o · axial · non-contrast · 0.45mm/px · z∈[+1695,+1815]mm · 13 of 28 slices shown, 17 images]
[im 2/28  brain]
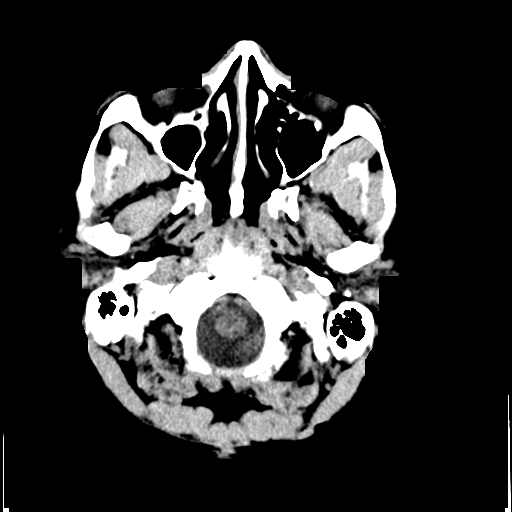
[im 2/28  bone]
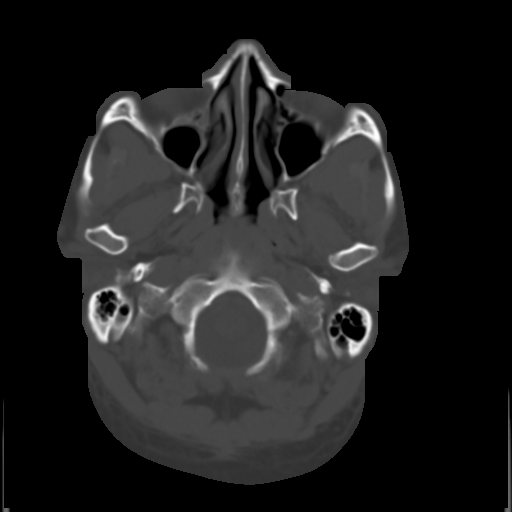
[im 4/28  brain]
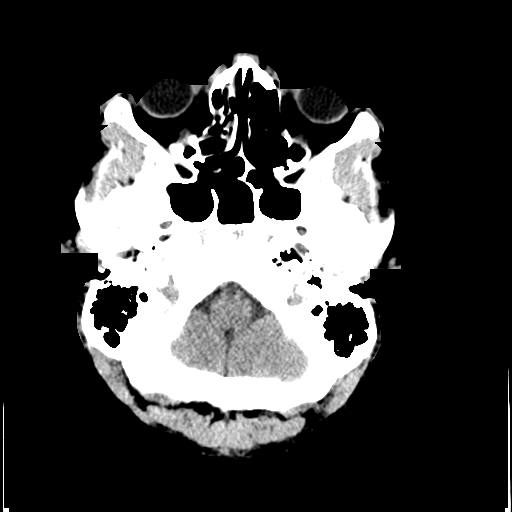
[im 6/28  brain]
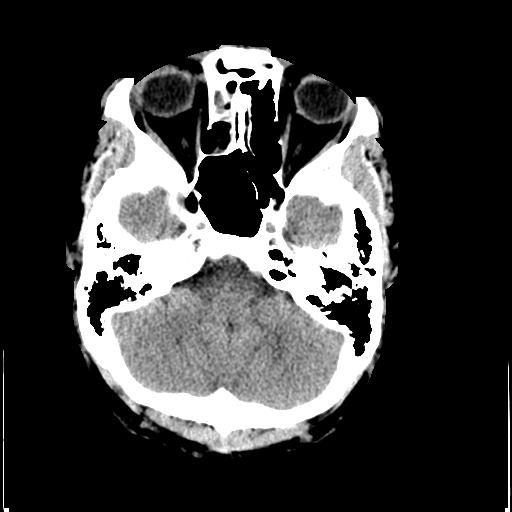
[im 8/28  brain]
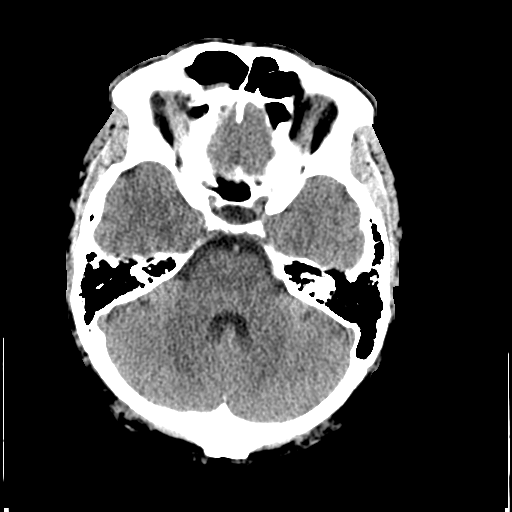
[im 10/28  brain]
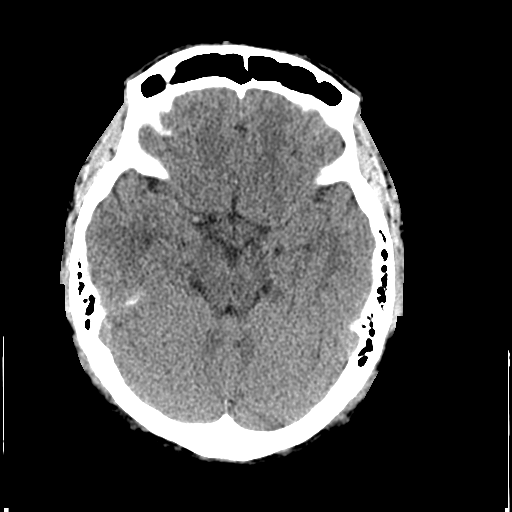
[im 10/28  bone]
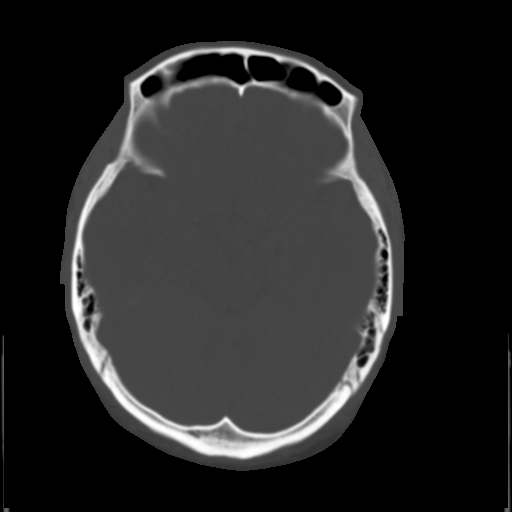
[im 12/28  brain]
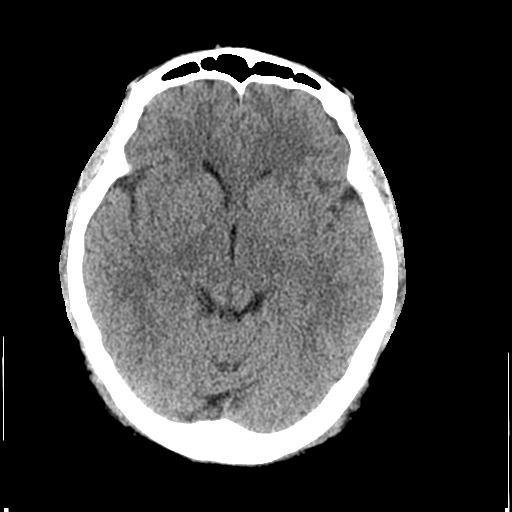
[im 14/28  brain]
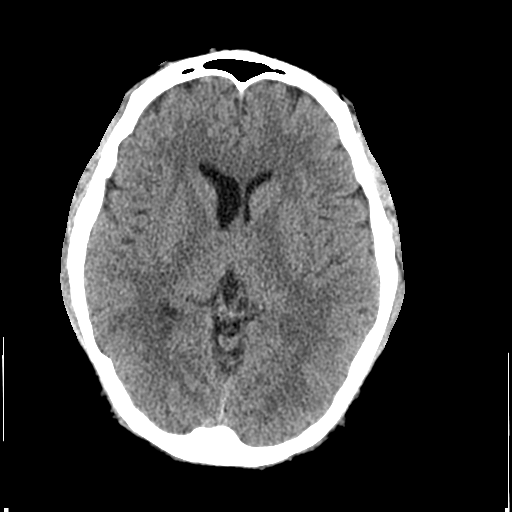
[im 16/28  brain]
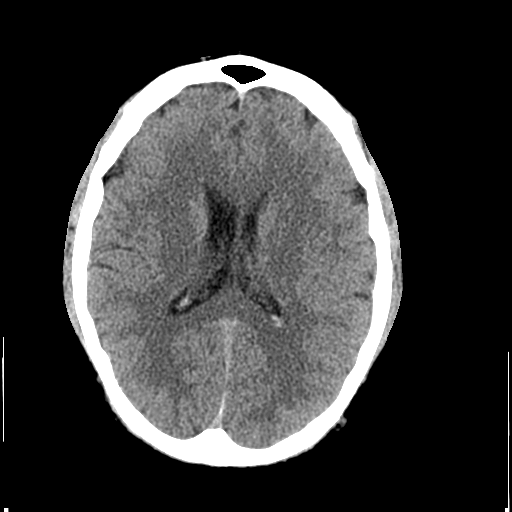
[im 18/28  brain]
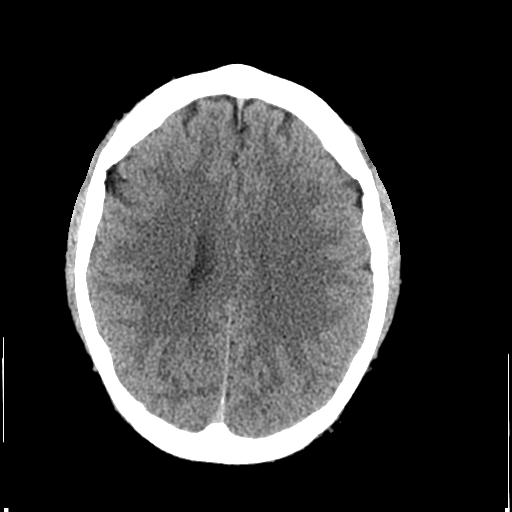
[im 18/28  bone]
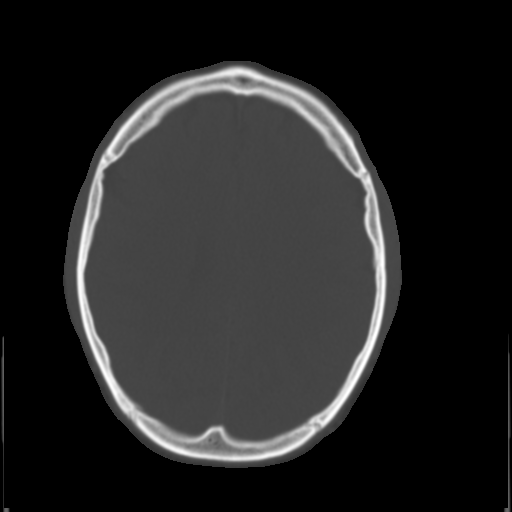
[im 20/28  brain]
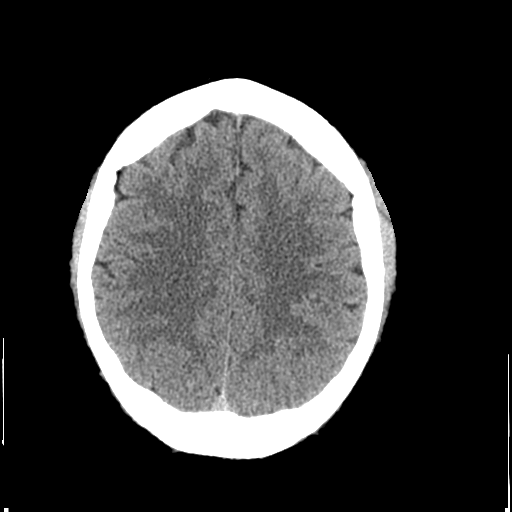
[im 22/28  brain]
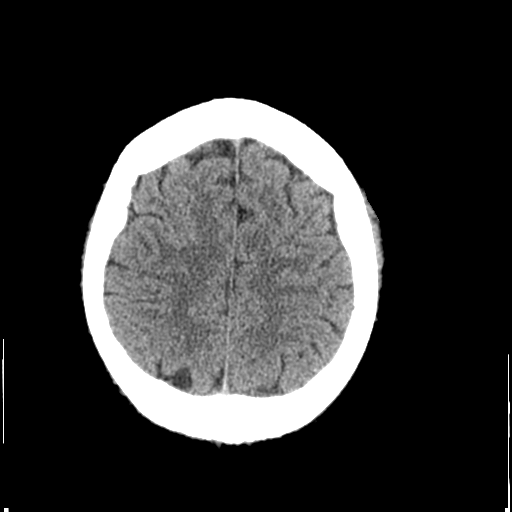
[im 24/28  brain]
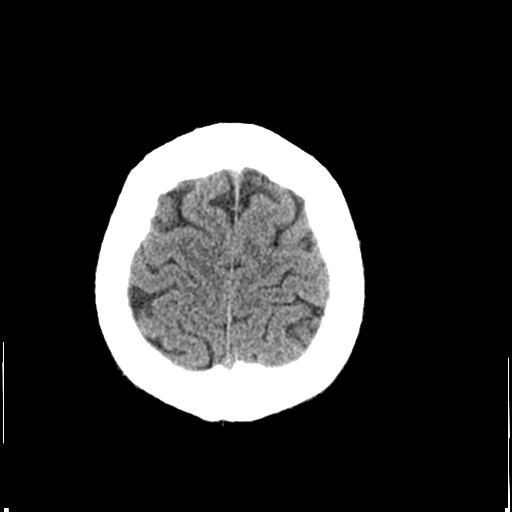
[im 26/28  brain]
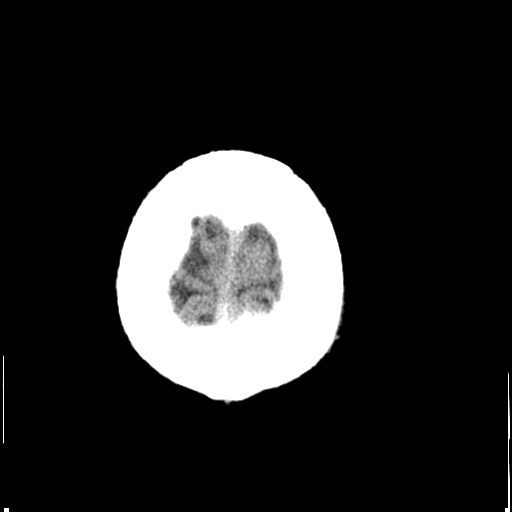
[im 26/28  bone]
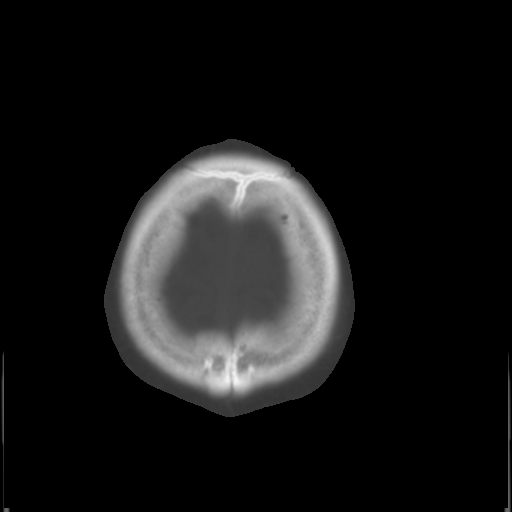

[Series 3: bone windows · axial · 0.45mm/px · z∈[+1695,+1735]mm · 3 of 28 slices shown]
[im 2/28  bone]
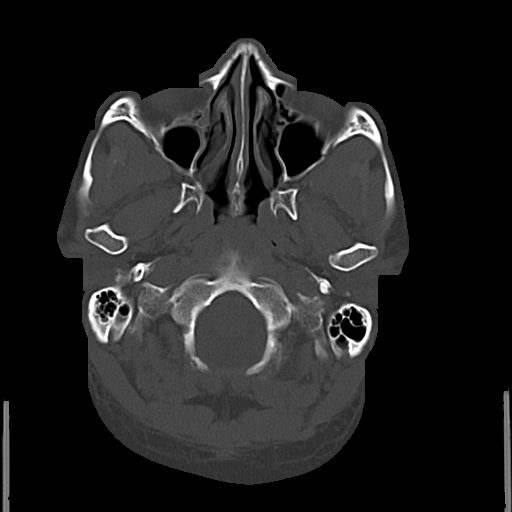
[im 6/28  bone]
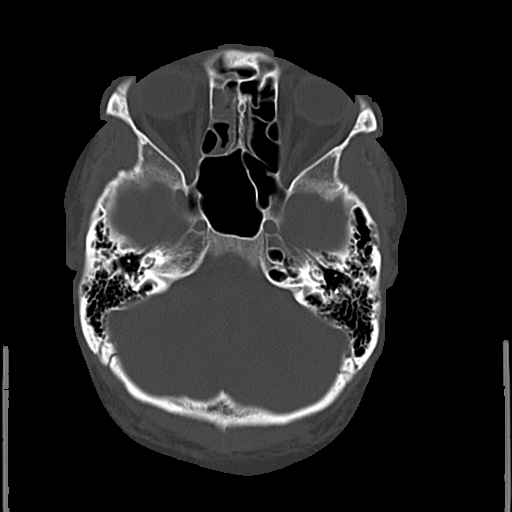
[im 10/28  bone]
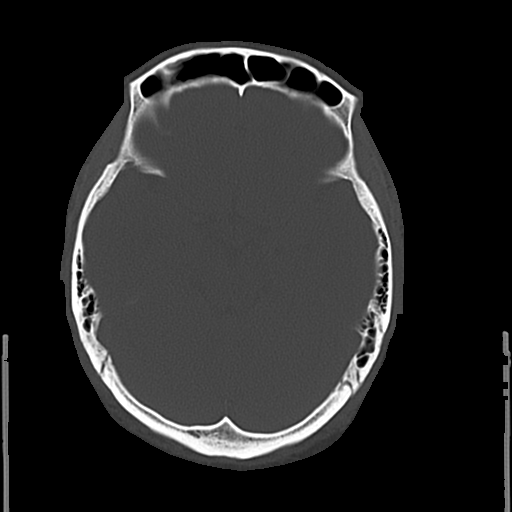

[16 of 30 positions shown; findings below may reference images not displayed]

FINDINGS: There is no evidence of mass, hemorrhage, extra-axial fluid, or
infarct. There is no hydrocephalus. Multiple right ethmoid air cells
are opacified to varying degrees, similar to prior study, with this
process again extending into the right frontal sinus. Calvarium is
intact.
IMPRESSION: Mild chronic sinusitis.  No acute findings.

## 2017-03-27 ENCOUNTER — Emergency Department
Admission: EM | Admit: 2017-03-27 | Discharge: 2017-03-27 | Disposition: A | Discharge status: Home / Self Care | Payer: Self-pay | Attending: Student in an Organized Health Care Education/Training Program | Admitting: Student in an Organized Health Care Education/Training Program

## 2017-03-27 ENCOUNTER — Encounter: Payer: Self-pay | Admitting: Intensive Care

## 2017-03-27 DIAGNOSIS — R05 Cough: Secondary | ICD-10-CM | POA: Insufficient documentation

## 2017-03-27 DIAGNOSIS — J45909 Unspecified asthma, uncomplicated: Secondary | ICD-10-CM | POA: Insufficient documentation

## 2017-03-27 DIAGNOSIS — B349 Viral infection, unspecified: Secondary | ICD-10-CM | POA: Insufficient documentation

## 2017-03-27 DIAGNOSIS — J069 Acute upper respiratory infection, unspecified: Secondary | ICD-10-CM | POA: Insufficient documentation

## 2017-03-27 DIAGNOSIS — F17210 Nicotine dependence, cigarettes, uncomplicated: Secondary | ICD-10-CM | POA: Insufficient documentation

## 2017-03-27 DIAGNOSIS — B9789 Other viral agents as the cause of diseases classified elsewhere: Secondary | ICD-10-CM

## 2017-03-27 LAB — POCT RAPID STREP A: Streptococcus, Group A Screen (Direct): NEGATIVE

## 2017-03-27 MED ORDER — AZITHROMYCIN 250 MG PO TABS: ORAL_TABLET | ORAL | 0 refills | Status: AC

## 2017-03-27 MED ORDER — ALBUTEROL SULFATE HFA 108 (90 BASE) MCG/ACT IN AERS: 2.0000 | INHALATION_SPRAY | Freq: Four times a day (QID) | RESPIRATORY_TRACT | 0 refills | Status: DC | PRN

## 2017-03-27 MED ORDER — BENZONATATE 100 MG PO CAPS: 100.0000 mg | ORAL_CAPSULE | Freq: Three times a day (TID) | ORAL | 0 refills | Status: DC | PRN

## 2017-03-27 MED ORDER — IPRATROPIUM-ALBUTEROL 0.5-2.5 (3) MG/3ML IN SOLN
3.0000 mL | Freq: Once | RESPIRATORY_TRACT | Status: AC
  Administered 2017-03-27: 3 mL via RESPIRATORY_TRACT
  Filled 2017-03-27: qty 3

## 2017-03-27 MED ORDER — PREDNISONE 10 MG PO TABS: 10.0000 mg | ORAL_TABLET | Freq: Two times a day (BID) | ORAL | 0 refills | Status: DC

## 2017-03-27 NOTE — ED Notes (Signed)
Pt verbalized understanding of discharge instructions. NAD at this time. 

## 2017-03-27 NOTE — Discharge Instructions (Signed)
Your symptoms seem to improve after the breathing treatment. You are being treated for a form of bronchitis and wheezing. Take the prescription meds as directed. Follow-up with your provider for continued symptoms. Return to the ED as needed.

## 2017-03-27 NOTE — ED Provider Notes (Signed)
[facility] Provider Note ____________________________________________  Time seen: 1245  I have reviewed the triage vital signs and the nursing notes.  HISTORY  Chief Complaint  Wheezing and Sore Throat  HPI Mark Cruz is a 41 y.o. male presents to the ED for evaluation of wheezing and pressure.  Patient gives a history of asthma and is a current everyday smoker.  He reports not smoking over the last 2 days secondary to his symptoms.  He denies any interim fevers, chills, sweats.  He does note that he has been out of his inhaler for several days.  He also complains of a sore throat related to the cough.  He reports a nonproductive cough and denies any cough induced vomiting.  He is not been taking any medications for his symptom relief.  Past Medical History:  Diagnosis Date  . Asthma     Patient Active Problem List   Diagnosis Date Noted  . Syncope 07/30/2013    Past Surgical History:  Procedure Laterality Date  . orchectomy Left     Prior to Admission medications   Medication Sig Start Date End Date Taking? Authorizing Provider  albuterol (PROVENTIL HFA;VENTOLIN HFA) 108 (90 BASE) MCG/ACT inhaler Inhale 1-2 puffs into the lungs every 6 (six) hours as needed for wheezing or shortness of breath. 03/24/13   Santiago GladLaisure, Heather, PA-C  albuterol (PROVENTIL HFA;VENTOLIN HFA) 108 (90 Base) MCG/ACT inhaler Inhale 2 puffs every 6 (six) hours as needed into the lungs for wheezing or shortness of breath. 03/27/17   Kayona Foor, Charlesetta IvoryJenise V Bacon, PA-C  azithromycin (ZITHROMAX Z-PAK) 250 MG tablet Take 2 tablets (500 mg) on  Day 1,  followed by 1 tablet (250 mg) once daily on Days 2 through 5. 03/27/17 04/01/17  Verma Grothaus, Charlesetta IvoryJenise V Bacon, PA-C  benzonatate (TESSALON PERLES) 100 MG capsule Take 1 capsule (100 mg total) 3 (three) times daily as needed by mouth for cough (Take 1-2 per dose). 03/27/17   Terrell Ostrand, Charlesetta IvoryJenise V Bacon, PA-C  predniSONE  (DELTASONE) 10 MG tablet Take 1 tablet (10 mg total) 2 (two) times daily with a meal by mouth. 03/27/17   Kostantinos Tallman, Charlesetta IvoryJenise V Bacon, PA-C   Allergies Tramadol; Fish allergy; and Tylenol [acetaminophen]  Family History  Problem Relation Age of Onset  . Hypertension Mother   . Hypertension Father     Social History Social History   Tobacco Use  . Smoking status: Current Every Day Smoker    Packs/day: 0.50    Years: 5.00    Pack years: 2.50    Types: Cigarettes  . Smokeless tobacco: Never Used  Substance Use Topics  . Alcohol use: Yes    Comment: socially  . Drug use: No    Review of Systems  Constitutional: Negative for fever. Eyes: Negative for visual changes. ENT: Negative for sore throat. Cardiovascular: Negative for chest pain. Respiratory: Positive for shortness of breath, cough , and wheezing. Neurological: Negative for headaches, focal weakness or numbness. ____________________________________________  PHYSICAL EXAM:  VITAL SIGNS: ED Triage Vitals  Enc Vitals Group     BP 03/27/17 1130 (!) 145/88     Pulse Rate 03/27/17 1130 70     Resp 03/27/17 1130 16     Temp 03/27/17 1130 98.6 F (37 C)     Temp Source 03/27/17 1130 Oral     SpO2 03/27/17 1130 97 %     Weight 03/27/17 1131 225 lb (102.1 kg)     Height 03/27/17 1131 6\' 2"  (  1.88 m)     Head Circumference --      Peak Flow --      Pain Score 03/27/17 1130 6     Pain Loc --      Pain Edu? --      Excl. in GC? --     Constitutional: Alert and oriented. Well appearing and in no distress. Head: Normocephalic and atraumatic. Eyes: Conjunctivae are normal. Normal extraocular movements Ears: Canals clear. TMs intact bilaterally. Nose: No congestion/rhinorrhea/epistaxis. Mouth/Throat: Mucous membranes are moist. Cardiovascular: Normal rate, regular rhythm. Normal distal pulses. Respiratory: Normal respiratory effort. No rales/rhonchi.  Audible end-expiratory wheezes noted bilaterally. Musculoskeletal:  Nontender with normal range of motion in all extremities.  Neurologic:  Normal gait without ataxia. Normal speech and language. No gross focal neurologic deficits are appreciated. ____________________________________________   LABS (pertinent positives/negatives)  Labs Reviewed  POCT RAPID STREP A  ____________________________________________   RADIOLOGY  Patient declined CXR ____________________________________________  PROCEDURES  DuoNeb x 1 ____________________________________________  INITIAL IMPRESSION / ASSESSMENT AND PLAN / ED COURSE  Patient with ED evaluation of cough and wheezing on presentation.  Patient believes the symptoms are related to the fact that he has not had his inhaler for several days.  He reports improvement following nebulizer treatment in the ED.  He has subsequently asked to not have a chest x-ray performed, site and his overall improvement.  He will be discharged with prescriptions for albuterol inhaler, Tessalon Perles, prednisone, and azithromycin.  He will follow-up with his primary care provider or return to the ED as needed for worsening symptoms. ____________________________________________  FINAL CLINICAL IMPRESSION(S) / ED DIAGNOSES  Final diagnoses:  Viral URI with cough      Mark Cruz, Charlesetta IvoryJenise V Bacon, PA-C 03/27/17 1426    Mark Cruz, Patrick, MD 03/27/17 1728

## 2017-03-27 NOTE — ED Triage Notes (Addendum)
Patient reports having wheezing when trying to take a good deep breath and feels pressure. Hx asthma and everyday smoker. Patient is out of inhaler at this time. A&O x4. No respiratory distress noted in triage. Also c/o sore throat. HX strep

## 2017-11-05 ENCOUNTER — Encounter (HOSPITAL_COMMUNITY): Payer: Self-pay | Admitting: Emergency Medicine

## 2017-11-05 ENCOUNTER — Emergency Department (HOSPITAL_COMMUNITY)
Admission: EM | Admit: 2017-11-05 | Discharge: 2017-11-05 | Disposition: A | Discharge status: Home / Self Care | Payer: Self-pay | Attending: Emergency Medicine | Admitting: Emergency Medicine

## 2017-11-05 ENCOUNTER — Other Ambulatory Visit: Payer: Self-pay

## 2017-11-05 DIAGNOSIS — N390 Urinary tract infection, site not specified: Secondary | ICD-10-CM | POA: Insufficient documentation

## 2017-11-05 DIAGNOSIS — Z711 Person with feared health complaint in whom no diagnosis is made: Secondary | ICD-10-CM

## 2017-11-05 DIAGNOSIS — R300 Dysuria: Secondary | ICD-10-CM | POA: Insufficient documentation

## 2017-11-05 DIAGNOSIS — Z202 Contact with and (suspected) exposure to infections with a predominantly sexual mode of transmission: Secondary | ICD-10-CM | POA: Insufficient documentation

## 2017-11-05 DIAGNOSIS — F17210 Nicotine dependence, cigarettes, uncomplicated: Secondary | ICD-10-CM | POA: Insufficient documentation

## 2017-11-05 LAB — URINALYSIS, ROUTINE W REFLEX MICROSCOPIC
Bilirubin Urine: NEGATIVE
GLUCOSE, UA: NEGATIVE mg/dL
KETONES UR: NEGATIVE mg/dL
NITRITE: POSITIVE — AB
PROTEIN: 30 mg/dL — AB
Specific Gravity, Urine: 1.018 (ref 1.005–1.030)
pH: 5 (ref 5.0–8.0)

## 2017-11-05 MED ORDER — IBUPROFEN 600 MG PO TABS: 600.0000 mg | ORAL_TABLET | Freq: Four times a day (QID) | ORAL | 0 refills | Status: AC | PRN

## 2017-11-05 MED ORDER — DOXYCYCLINE HYCLATE 100 MG PO CAPS: 100.0000 mg | ORAL_CAPSULE | Freq: Two times a day (BID) | ORAL | 0 refills | Status: DC

## 2017-11-05 MED ORDER — CEFTRIAXONE SODIUM 250 MG IJ SOLR
250.0000 mg | Freq: Once | INTRAMUSCULAR | Status: AC
  Administered 2017-11-05: 250 mg via INTRAMUSCULAR
  Filled 2017-11-05: qty 250

## 2017-11-05 MED ORDER — AZITHROMYCIN 250 MG PO TABS
1000.0000 mg | ORAL_TABLET | Freq: Once | ORAL | Status: AC
  Administered 2017-11-05: 1000 mg via ORAL
  Filled 2017-11-05: qty 4

## 2017-11-05 MED ORDER — STERILE WATER FOR INJECTION IJ SOLN
INTRAMUSCULAR | Status: AC
  Administered 2017-11-05: 10 mL
  Filled 2017-11-05: qty 10

## 2017-11-05 NOTE — ED Provider Notes (Signed)
[facility]-EMERGENCY DEPT Provider Note   CSN: [id number] Arrival date & time: 11/05/17  0446     History   Chief Complaint Chief Complaint  Patient presents with  . Penile Discharge    HPI Mark Cruz is a 42 y.o. male.  The history is provided by the patient. No language interpreter was used.  Penile Discharge      42 year old male presenting requesting for STD check.  Patient report burning on urination with increased urinary frequency, urgency as well as having penile discharge which started last night.  He is concerned for STD.  He has never been tested positive for STD in the past.  He endorsed a reactive fever and chills.  No abdominal pain or back pain, no testicular pain and no rash.  He has had 3 separate sexual partners within the past 6 months.  He has been using protection recently.  He has had prior left orchiectomy.  He denies any specific treatment tried.  He is not allergic to any antibiotic.  His symptom is mild to moderate.  It is non-radiating.  Past Medical History:  Diagnosis Date  . Asthma     Patient Active Problem List   Diagnosis Date Noted  . Syncope 07/30/2013    Past Surgical History:  Procedure Laterality Date  . orchectomy Left         Home Medications    Prior to Admission medications   Medication Sig Start Date End Date Taking? Authorizing Provider  albuterol (PROVENTIL HFA;VENTOLIN HFA) 108 (90 BASE) MCG/ACT inhaler Inhale 1-2 puffs into the lungs every 6 (six) hours as needed for wheezing or shortness of breath. 03/24/13   Santiago Glad, PA-C  albuterol (PROVENTIL HFA;VENTOLIN HFA) 108 (90 Base) MCG/ACT inhaler Inhale 2 puffs every 6 (six) hours as needed into the lungs for wheezing or shortness of breath. 03/27/17   Menshew, Charlesetta Ivory, PA-C  benzonatate (TESSALON PERLES) 100 MG capsule Take 1 capsule (100 mg total) 3 (three) times daily as needed by mouth for cough (Take 1-2 per dose).  03/27/17   Menshew, Charlesetta Ivory, PA-C  predniSONE (DELTASONE) 10 MG tablet Take 1 tablet (10 mg total) 2 (two) times daily with a meal by mouth. 03/27/17   Menshew, Charlesetta Ivory, PA-C    Family History Family History  Problem Relation Age of Onset  . Hypertension Mother   . Hypertension Father     Social History Social History   Tobacco Use  . Smoking status: Current Every Day Smoker    Packs/day: 0.50    Years: 5.00    Pack years: 2.50    Types: Cigarettes  . Smokeless tobacco: Never Used  Substance Use Topics  . Alcohol use: Yes    Comment: socially  . Drug use: No     Allergies   Tramadol; Fish allergy; and Tylenol [acetaminophen]   Review of Systems Review of Systems  Genitourinary: Positive for discharge.  All other systems reviewed and are negative.    Physical Exam Updated Vital Signs BP 131/73 (BP Location: Left Arm)   Pulse 99   Temp 99.8 F (37.7 C) (Oral)   Resp 15   Ht 6\' 2"  (1.88 m)   Wt 95.3 kg (210 lb)   SpO2 100%   BMI 26.96 kg/m   Physical Exam  Constitutional: He appears well-developed and well-nourished. No distress.  HENT:  Head: Atraumatic.  Eyes: Conjunctivae are normal.  Neck: Neck supple.  Abdominal: Soft. He  exhibits no distension.  No CVA tenderness  Genitourinary:  Genitourinary Comments: Chaperone present during exam.  Normal circumcised penis free of lesion or rash.  No obvious penile discharge.  Absent left testicle.  No hernia noted.  Scrotum soft and nontender without any rash or lesion.  No inguinal lymph adenopathy or inguinal hernia noted.  Neurological: He is alert.  Skin: No rash noted.  Psychiatric: He has a normal mood and affect.  Nursing note and vitals reviewed.    ED Treatments / Results  Labs (all labs ordered are listed, but only abnormal results are displayed) Labs Reviewed  URINALYSIS, ROUTINE W REFLEX MICROSCOPIC - Abnormal; Notable for the following components:      Result Value   Color,  Urine AMBER (*)    APPearance HAZY (*)    Hgb urine dipstick MODERATE (*)    Protein, ur 30 (*)    Nitrite POSITIVE (*)    Leukocytes, UA LARGE (*)    WBC, UA >50 (*)    Bacteria, UA MANY (*)    All other components within normal limits  URINE CULTURE  RPR  HIV ANTIBODY (ROUTINE TESTING)  GC/CHLAMYDIA PROBE AMP ([place]) NOT AT [facility]    EKG None  Radiology No results found.  Procedures Procedures (including critical care time)  Medications Ordered in ED Medications  cefTRIAXone (ROCEPHIN) injection 250 mg (250 mg Intramuscular Given 11/05/17 0811)  azithromycin (ZITHROMAX) tablet 1,000 mg (1,000 mg Oral Given 11/05/17 [id number])  sterile water (preservative free) injection (10 mLs  Given 11/05/17 0813)     Initial Impression / Assessment and Plan / ED Course  I have reviewed the triage vital signs and the nursing notes.  Pertinent labs & imaging results that were available during my care of the patient were reviewed by me and considered in my medical decision making (see chart for details).     BP 112/72 (BP Location: Right Arm)   Pulse 95   Temp 99.8 F (37.7 C) (Oral)   Resp 19   Ht 6\' 2"  (1.88 m)   Wt 95.3 kg (210 lb)   SpO2 100%   BMI 26.96 kg/m    Final Clinical Impressions(s) / ED Diagnoses   Final diagnoses:  Lower urinary tract infectious disease  Concern about STD in male without diagnosis    ED Discharge Orders        Ordered    ibuprofen (ADVIL,MOTRIN) 600 MG tablet  Every 6 hours PRN     11/05/17 0842    doxycycline (VIBRAMYCIN) 100 MG capsule  2 times daily     11/05/17 0842     8:05 AM Patient here with dysuria and penile discharge concerning for STI.  STD screening ordered.  Patient will receive prophylactic antibiotic including Rocephin and Zithromax.  8:41 AM Urine with findings suggestive of urinary tract infection.  Patient without CVA tenderness concerning for obstructive kidney stone.  Patient discharged home with doxycycline for  10 days.  Return precautions discussed.  Encourage sexual abstinence until symptoms completely cure.     Fayrene Helperran, Shaquil Aldana, PA-C 11/05/17 0845    Samuel JesterMcManus, Kathleen, DO 11/09/17 1334

## 2017-11-05 NOTE — ED Notes (Signed)
Patient aware we need urine sample. Urinal at bedside.  

## 2017-11-05 NOTE — ED Triage Notes (Signed)
Pt arriving with concern of STD exposure. Pt states he has pain when urinating and has noticed an abnormal discharge coming from his penis.

## 2017-11-06 LAB — GC/CHLAMYDIA PROBE AMP (~~LOC~~) NOT AT ARMC
Chlamydia: NEGATIVE
Neisseria Gonorrhea: NEGATIVE

## 2017-11-06 LAB — RPR, QUANT+TP ABS (REFLEX)
Rapid Plasma Reagin, Quant: 1:8 {titer} — ABNORMAL HIGH
T Pallidum Abs: POSITIVE — AB

## 2017-11-06 LAB — RPR: RPR: REACTIVE — AB

## 2017-11-06 LAB — HIV ANTIBODY (ROUTINE TESTING W REFLEX): HIV SCREEN 4TH GENERATION: NONREACTIVE

## 2017-11-07 LAB — URINE CULTURE

## 2017-11-08 ENCOUNTER — Telehealth: Payer: Self-pay | Admitting: *Deleted

## 2017-11-08 NOTE — Progress Notes (Signed)
ED Antimicrobial Stewardship Positive Culture Follow Up   Mark Cruz is an 42 y.o. male who presented to [facility]/2017 with a chief complaint of  Chief Complaint  Patient presents with  . Penile Discharge    Recent Results (from the past 720 hour(s))  Urine Culture     Status: Abnormal   Collection Time: 11/05/17  7:55 AM  Result Value Ref Range Status   Specimen Description   Final    URINE, RANDOM Performed at [facility], [address] [place], [place] [address]    Special Requests   Final    NONE Performed at [facility], [address] [place], [place] [address]    Culture >=100,000 COLONIES/mL KLEBSIELLA PNEUMONIAE (A)  Final   Report Status 11/07/2017 FINAL  Final   Organism ID, Bacteria KLEBSIELLA PNEUMONIAE (A)  Final      Susceptibility   Klebsiella pneumoniae - MIC*    AMPICILLIN RESISTANT Resistant     CEFAZOLIN <=4 SENSITIVE Sensitive     CEFTRIAXONE <=1 SENSITIVE Sensitive     CIPROFLOXACIN <=0.25 SENSITIVE Sensitive     GENTAMICIN <=1 SENSITIVE Sensitive     IMIPENEM <=0.25 SENSITIVE Sensitive     NITROFURANTOIN 64 INTERMEDIATE Intermediate     TRIMETH/SULFA <=20 SENSITIVE Sensitive     AMPICILLIN/SULBACTAM <=2 SENSITIVE Sensitive     PIP/TAZO <=4 SENSITIVE Sensitive     Extended ESBL NEGATIVE Sensitive     * >=100,000 COLONIES/mL KLEBSIELLA PNEUMONIAE   41 YOM presented with dysuria and abnormal discharge from penis. Concern for STD. UA indicative of UTI. RPR reactive, indicative of syphilis. Unclear if latent or early.   Recommend bringing patient in for benzathine pen G 2.4 million units IM x 3 doses at 1 week intervals.  Recommend starting Bactrim DS BID x 7 days for UTI.  ED Provider: Alveria ApleySophia Caccavale, PA-C   Iokepa Geffre A Freida Nebel 11/08/2017, 10:21 AM Infectious Diseases Pharmacist Phone# [phone]

## 2017-11-08 NOTE — Telephone Encounter (Signed)
Post ED Visit - Positive Culture Follow-up: Unsuccessful Patient Follow-up  Culture assessed and recommendations reviewed by:  []  Enzo BiNathan Batchelder, Pharm.D. []  Celedonio MiyamotoJere my Frens, Pharm.D., BCPS AQ-ID []  Garvin FilaMike Maccia, Pharm.D., BCPS []  Georgina PillionElizabeth Martin, Pharm.D., BCPS []  CliveMinh Pham, [address]D., BCPS, AAHIVP []  Estella HuskMichelle Turner, Pharm.D., BCPS, AAHIVP []  Sherlynn CarbonAustin Lucas, PharmD []  Pollyann SamplesAndy Johnston, PharmD, BCPS Ladell PierBrooke Baggett, PharmD  Positive urine culture, reviewed by Alveria ApleySophia Caccavale, PA-C  []  Patient discharged without antimicrobial prescription and treatment is now indicated []  Organism is resistant to prescribed ED discharge antimicrobial []  Patient with positive blood cultures   Unable to contact patient after 3 attempts.  Numbers listed on file are wrong or non working numbers.  Letter will be sent to address on file  Lysle PearlRobertson, Bella Brummet Talley 11/08/2017, 10:12 AM

## 2018-01-12 ENCOUNTER — Emergency Department (HOSPITAL_COMMUNITY)
Admission: EM | Admit: 2018-01-12 | Discharge: 2018-01-12 | Disposition: A | Discharge status: Home / Self Care | Payer: Self-pay | Attending: Emergency Medicine | Admitting: Emergency Medicine

## 2018-01-12 ENCOUNTER — Encounter (HOSPITAL_COMMUNITY): Payer: Self-pay | Admitting: Emergency Medicine

## 2018-01-12 ENCOUNTER — Emergency Department (HOSPITAL_COMMUNITY): Payer: Self-pay

## 2018-01-12 DIAGNOSIS — N3001 Acute cystitis with hematuria: Secondary | ICD-10-CM | POA: Insufficient documentation

## 2018-01-12 DIAGNOSIS — J45909 Unspecified asthma, uncomplicated: Secondary | ICD-10-CM | POA: Insufficient documentation

## 2018-01-12 DIAGNOSIS — A539 Syphilis, unspecified: Secondary | ICD-10-CM | POA: Insufficient documentation

## 2018-01-12 DIAGNOSIS — F17210 Nicotine dependence, cigarettes, uncomplicated: Secondary | ICD-10-CM | POA: Insufficient documentation

## 2018-01-12 LAB — CBC WITH DIFFERENTIAL/PLATELET
Basophils Absolute: 0 10*3/uL (ref 0.0–0.1)
Basophils Relative: 0 %
Eosinophils Absolute: 0.2 10*3/uL (ref 0.0–0.7)
Eosinophils Relative: 2 %
HCT: 44.5 % (ref 39.0–52.0)
Hemoglobin: 14.7 g/dL (ref 13.0–17.0)
Lymphocytes Relative: 25 %
Lymphs Abs: 3.1 10*3/uL (ref 0.7–4.0)
MCH: 26.1 pg (ref 26.0–34.0)
MCHC: 33 g/dL (ref 30.0–36.0)
MCV: 79 fL (ref 78.0–100.0)
Monocytes Absolute: 1.3 10*3/uL — ABNORMAL HIGH (ref 0.1–1.0)
Monocytes Relative: 11 %
Neutro Abs: 7.6 10*3/uL (ref 1.7–7.7)
Neutrophils Relative %: 62 %
Platelets: 304 10*3/uL (ref 150–400)
RBC: 5.63 MIL/uL (ref 4.22–5.81)
RDW: 15 % (ref 11.5–15.5)
WBC: 12.2 10*3/uL — ABNORMAL HIGH (ref 4.0–10.5)

## 2018-01-12 LAB — URINALYSIS, ROUTINE W REFLEX MICROSCOPIC
Bilirubin Urine: NEGATIVE
Glucose, UA: NEGATIVE mg/dL
Ketones, ur: NEGATIVE mg/dL
Nitrite: POSITIVE — AB
Protein, ur: NEGATIVE mg/dL
Specific Gravity, Urine: 1.023 (ref 1.005–1.030)
WBC, UA: >50 WBC/hpf — ABNORMAL HIGH (ref 0–5)
pH: 5 (ref 5.0–8.0)

## 2018-01-12 LAB — COMPREHENSIVE METABOLIC PANEL
ALT: 21 U/L (ref 0–44)
AST: 16 U/L (ref 15–41)
Albumin: 2.9 g/dL — ABNORMAL LOW (ref 3.5–5.0)
Alkaline Phosphatase: 78 U/L (ref 38–126)
Anion gap: 11 (ref 5–15)
BUN: 12 mg/dL (ref 6–20)
CO2: 28 mmol/L (ref 22–32)
Calcium: 8.7 mg/dL — ABNORMAL LOW (ref 8.9–10.3)
Chloride: 103 mmol/L (ref 98–111)
Creatinine, Ser: 1.14 mg/dL (ref 0.61–1.24)
GFR calc Af Amer: >60 mL/min (ref >60)
GFR calc non Af Amer: >60 mL/min (ref >60)
Glucose, Bld: 124 mg/dL — ABNORMAL HIGH (ref 70–99)
Potassium: 3.7 mmol/L (ref 3.5–5.1)
Sodium: 142 mmol/L (ref 135–145)
Total Bilirubin: 0.6 mg/dL (ref 0.3–1.2)
Total Protein: 6.8 g/dL (ref 6.5–8.1)

## 2018-01-12 LAB — LIPASE, BLOOD: Lipase: 37 U/L (ref 11–51)

## 2018-01-12 MED ORDER — CEPHALEXIN 500 MG PO CAPS: 500.0000 mg | ORAL_CAPSULE | Freq: Four times a day (QID) | ORAL | 0 refills | Status: AC

## 2018-01-12 MED ORDER — PENICILLIN G BENZATHINE 1200000 UNIT/2ML IM SUSP
2.4000 10*6.[IU] | Freq: Once | INTRAMUSCULAR | Status: AC
  Administered 2018-01-12: 2.4 10*6.[IU] via INTRAMUSCULAR
  Filled 2018-01-12: qty 4

## 2018-01-12 MED ORDER — SODIUM CHLORIDE 0.9 % IV BOLUS
1000.0000 mL | Freq: Once | INTRAVENOUS | Status: AC
  Administered 2018-01-12: 1000 mL via INTRAVENOUS

## 2018-01-12 MED ORDER — KETOROLAC TROMETHAMINE 30 MG/ML IJ SOLN
30.0000 mg | Freq: Once | INTRAMUSCULAR | Status: AC
  Administered 2018-01-12: 30 mg via INTRAVENOUS
  Filled 2018-01-12: qty 1

## 2018-01-12 MED ORDER — ONDANSETRON HCL 4 MG/2ML IJ SOLN
4.0000 mg | Freq: Once | INTRAMUSCULAR | Status: AC
  Administered 2018-01-12: 4 mg via INTRAVENOUS
  Filled 2018-01-12: qty 2

## 2018-01-12 MED ORDER — ALBUTEROL SULFATE HFA 108 (90 BASE) MCG/ACT IN AERS
1.0000 | INHALATION_SPRAY | Freq: Once | RESPIRATORY_TRACT | Status: AC
  Administered 2018-01-12: 1 via RESPIRATORY_TRACT
  Filled 2018-01-12: qty 6.7

## 2018-01-12 NOTE — Discharge Instructions (Addendum)
Please take all of your antibiotics until finished!   You may develop abdominal discomfort or diarrhea from the antibiotic.  You may help offset this with probiotics which you can buy or get in yogurt. Do not eat  or take the probiotics until 2 hours after your antibiotic.   Drink plenty of water.  Your syphilis test in June was positive.  You were treated for syphilis in the emergency department today.  Follow-up with the [facility] department for any further STD testing or treatment.  You should inform your sexual partners that you tested positive and instruct them to go to the health department for testing and treatment of syphilis.  I have also given you the information for the infectious disease doctor on call if you would like to follow-up with them.  Return to the emergency department if any concerning signs or symptoms develop such as fevers, rash, persistent vomiting, worsening abdominal pain, or altered mental status.

## 2018-01-12 NOTE — ED Notes (Signed)
Verified with lab, urine culture is in lab and will add on culture

## 2018-01-12 NOTE — ED Notes (Signed)
Bed: WA17 Expected date:  Expected time:  Means of arrival:  Comments: EMS 42 yo male chronic abd pain

## 2018-01-12 NOTE — ED Notes (Signed)
Pt tol injections well. IV removed and pt getting dressed.

## 2018-01-12 NOTE — ED Notes (Signed)
Urine culture sent down with UA. 

## 2018-01-12 NOTE — ED Provider Notes (Signed)
[facility]PT Provider Note   CSN: [id number] Arrival date & time: 01/12/18  6578     History   Chief Complaint Chief Complaint  Patient presents with  . Abdominal Pain    HPI Mark Cruz is a 42 y.o. male presents today for evaluation of acute onset, constant right lower quadrant abdominal pain for 2 days.  States pain is constant, throbbing, at times radiates to the right flank.  Denies testicular pain or scrotal swelling. No genital lesions, urethral discharge, pain with bowel movements, rashes, or penile pain. No aggravating or relieving factors noted.  He did have one episode of nonbloody nonbilious emesis yesterday.  He notes decreased urine output but today when he urinated he noticed some dysuria and dark urine.  Denies urgency or frequency.  Denies fevers or chills.  No diarrhea, constipation, melena, or hematochezia.  He has not tried anything for his symptoms.  States last time he felt similarly was in 2015.  He states he was told that he would need a surgery but "they just put a catheter in me ".  He has been sexual active with 2 male partners in the past year or so but does not always use protection.  The history is provided by the patient.    Past Medical History:  Diagnosis Date  . Asthma     Patient Active Problem List   Diagnosis Date Noted  . Syncope 07/30/2013    Past Surgical History:  Procedure Laterality Date  . orchectomy Left         Home Medications    Prior to Admission medications   Medication Sig Start Date End Date Taking? Authorizing Provider  albuterol (PROVENTIL HFA;VENTOLIN HFA) 108 (90 BASE) MCG/ACT inhaler Inhale 1-2 puffs into the lungs every 6 (six) hours as needed for wheezing or shortness of breath. 03/24/13  Yes Santiago Glad, PA-C  cephALEXin (KEFLEX) 500 MG capsule Take 1 capsule (500 mg total) by mouth 4 (four) times daily for 7 days. 01/12/18 01/19/18  Michela Pitcher A, PA-C    ibuprofen (ADVIL,MOTRIN) 600 MG tablet Take 1 tablet (600 mg total) by mouth every 6 (six) hours as needed. Patient not taking: Reported on 01/12/2018 11/05/17   Fayrene Helper, PA-C    Family History Family History  Problem Relation Age of Onset  . Hypertension Mother   . Hypertension Father     Social History Social History   Tobacco Use  . Smoking status: Current Every Day Smoker    Packs/day: 0.50    Years: 5.00    Pack years: 2.50    Types: Cigarettes  . Smokeless tobacco: Never Used  Substance Use Topics  . Alcohol use: Yes    Comment: socially  . Drug use: No     Allergies   Tramadol; Fish allergy; and Tylenol [acetaminophen]   Review of Systems Review of Systems  Constitutional: Negative for chills and fever.  Respiratory: Negative for shortness of breath.   Cardiovascular: Negative for chest pain.  Gastrointestinal: Positive for abdominal pain, nausea and vomiting.  Genitourinary: Positive for decreased urine volume and dysuria. Negative for hematuria.  All other systems reviewed and are negative.    Physical Exam Updated Vital Signs BP 117/85 Comment: Resp 16  Pulse 72 Comment: Resp 16  Temp 98.1 F (36.7 C) (Oral)   Resp 16   Ht 6\' 2"  (1.88 m)   Wt 95.3 kg   SpO2 100% Comment: Resp 16  BMI 26.96 kg/m  Physical Exam  Constitutional: He appears well-developed and well-nourished. No distress.  HENT:  Head: Normocephalic and atraumatic.  Eyes: Conjunctivae are normal. Right eye exhibits no discharge. Left eye exhibits no discharge.  Neck: No JVD present. No tracheal deviation present.  Cardiovascular: Normal rate, regular rhythm and normal heart sounds.  Pulmonary/Chest: Effort normal and breath sounds normal.  Abdominal: Soft. Bowel sounds are normal. He exhibits no distension. There is tenderness in the right upper quadrant, right lower quadrant, epigastric area, periumbilical area and suprapubic area. There is no rigidity, no rebound, no  guarding, no CVA tenderness, no tenderness at McBurney's point and negative Murphy's sign.  Maximally tender to palpation in the right lower quadrant.  Genitourinary:  Genitourinary Comments: deferred  Musculoskeletal: He exhibits no edema.  No midline spine tenderness, mild right paralumbar muscle tenderness.  No deformity, crepitus, or step-off noted.  Neurological: He is alert.  Skin: Skin is warm and dry. No erythema.  Psychiatric: He has a normal mood and affect. His behavior is normal.  Nursing note and vitals reviewed.    ED Treatments / Results  Labs (all labs ordered are listed, but only abnormal results are displayed) Labs Reviewed  CBC WITH DIFFERENTIAL/PLATELET - Abnormal; Notable for the following components:      Result Value   WBC 12.2 (*)    Monocytes Absolute 1.3 (*)    All other components within normal limits  COMPREHENSIVE METABOLIC PANEL - Abnormal; Notable for the following components:   Glucose, Bld 124 (*)    Calcium 8.7 (*)    Albumin 2.9 (*)    All other components within normal limits  URINALYSIS, ROUTINE W REFLEX MICROSCOPIC - Abnormal; Notable for the following components:   Color, Urine AMBER (*)    APPearance HAZY (*)    Hgb urine dipstick SMALL (*)    Nitrite POSITIVE (*)    Leukocytes, UA LARGE (*)    WBC, UA >50 (*)    Bacteria, UA MANY (*)    All other components within normal limits  URINE CULTURE  LIPASE, BLOOD    EKG None  Radiology Ct Renal Stone Study  Result Date: 01/12/2018 CLINICAL DATA:  42 year old male with right flank pain EXAM: CT ABDOMEN AND PELVIS WITHOUT CONTRAST TECHNIQUE: Multidetector CT imaging of the abdomen and pelvis was performed following the standard protocol without IV contrast. COMPARISON:  Prior CT scan of the abdomen and pelvis 06/04/2013 FINDINGS: Lower chest: The lung bases are clear. Visualized cardiac structures are within normal limits for size. No pericardial effusion. Unremarkable visualized distal  thoracic esophagus. Hepatobiliary: Normal hepatic contour and morphology. No discrete hepatic lesions. Normal appearance of the gallbladder. No intra or extrahepatic biliary ductal dilatation. Pancreas: Unremarkable. No pancreatic ductal dilatation or surrounding inflammatory changes. Spleen: Normal in size without focal abnormality. Adrenals/Urinary Tract: Normal adrenal glands. No evidence of hydronephrosis or nephrolithiasis. No asymmetric perinephric stranding. No ureteral stones identified. Tiny punctate radiopacity in the soft tissues on the right aspect of the bladder base was not present on prior imaging from 2015, however this is not along the course of the ureter and likely represents a new phlebolith. Stomach/Bowel: No evidence of obstruction or focal bowel wall thickening. Normal appendix in the right lower quadrant. The terminal ileum is unremarkable. Vascular/Lymphatic: Limited evaluation in the absence of intravenous contrast. No evidence of aneurysm. No atherosclerotic plaque or suspicious lymphadenopathy. Reproductive: Prostate is unremarkable. Other: No abdominal wall hernia or abnormality. No abdominopelvic ascites. Musculoskeletal: No acute fracture or aggressive  appearing lytic or blastic osseous lesion. IMPRESSION: Negative CT scan of the abdomen and pelvis. Electronically Signed   By: Malachy Moan M.D.   On: 01/12/2018 08:13    Procedures Procedures (including critical care time)  Medications Ordered in ED Medications  sodium chloride 0.9 % bolus 1,000 mL (0 mLs Intravenous Stopped 01/12/18 0805)  ketorolac (TORADOL) 30 MG/ML injection 30 mg (30 mg Intravenous Given 01/12/18 0652)  ondansetron (ZOFRAN) injection 4 mg (4 mg Intravenous Given 01/12/18 0652)  penicillin g benzathine (BICILLIN LA) 1200000 UNIT/2ML injection 2.4 Million Units (2.4 Million Units Intramuscular Given 01/12/18 0859)  albuterol (PROVENTIL HFA;VENTOLIN HFA) 108 (90 Base) MCG/ACT inhaler 1 puff (1 puff Inhalation  Given 01/12/18 0903)     Initial Impression / Assessment and Plan / ED Course  I have reviewed the triage vital signs and the nursing notes.  Pertinent labs & imaging results that were available during my care of the patient were reviewed by me and considered in my medical decision making (see chart for details).     Patient with complaint of right-sided abdominal pain and dysuria.  He is afebrile, vital signs are stable.  He is nontoxic in appearance.  No peritoneal signs on examination of the abdomen.  CT of the abdomen and pelvis shows no evidence of nephrolithiasis, obstruction, perforation, appendicitis, colitis, or other acute surgical abdominal pathology.  Lab work reviewed by me shows leukocytosis with mild left shift, no metabolic derangements.  LFTs, creatinine, and lipase within normal limits.  UA is suggestive of UTI, will culture.  Chart review shows that he presented to the ED on November 05, 2017 with complaint of urinary symptoms and and urethral discharge.  At the time he had evidence of UTI which was cultured and grew Klebsiella pneumonia.  He received treatment for presumed gonorrhea and chlamydia in the ED and discharged on a 10-day course of doxycycline.  His syphilis test at the time was positive and it appears that staff attempted to contact the patient but the phone numbers on file were either disconnected or the wrong number.  His results were faxed to the [facility] department at the time but he states he was never informed that he had tested positive for syphilis and has not received treatment for syphilis.  He denies any urethral discharge, syphilitic chancre or other genital lesions, or any other GU complaints and he declines any further STD testing today.  GU exam was deferred by the patient.  HIV test in June was negative.  He received high-dose IM Bicillin in the ED today and we will discharge with course of Keflex which the last urine culture showed was susceptible.   On reevaluation, patient is resting comfortably no apparent distress.  Serial abdominal examinations remain benign.  He is tolerating p.o. fluids in the ED without difficulty.  Recommend follow-up with the [facility] department and instructed the patient to inform his sexual partners that he tested positive so that they may get testing and treatment as well.  No evidence of orchitis, testicular torsion, or epididymitis.  Discussed strict ED return precautions. Pt verbalized understanding of and agreement with plan and is safe for discharge home at this time.   Final Clinical Impressions(s) / ED Diagnoses   Final diagnoses:  Acute cystitis with hematuria  Syphilis in male    ED Discharge Orders         Ordered    cephALEXin (KEFLEX) 500 MG capsule  4 times daily  01/12/18 0913           Jeanie Sewer, PA-C 01/12/18 8469    Gilda Crease, MD 01/12/18 2348

## 2018-01-12 NOTE — ED Triage Notes (Signed)
Patient complains of abdominal pain for the last 2 days. Patient has a history of this pain.

## 2018-01-14 LAB — URINE CULTURE: Culture: >=100000 — AB

## 2018-01-15 ENCOUNTER — Telehealth: Payer: Self-pay | Admitting: Emergency Medicine

## 2018-01-15 NOTE — Telephone Encounter (Signed)
Post ED Visit - Positive Culture Follow-up  Culture report reviewed by antimicrobial stewardship pharmacist:  []  Enzo Bi, Pharm.D. []  Celedonio Miyamoto, Pharm.D., BCPS AQ-ID []  Garvin Fila, Pharm.D., BCPS []  Georgina Pillion, Pharm.D., BCPS []  [place], [address]D., BCPS, AAHIVP []  Estella Husk, Pharm.D., BCPS, AAHIVP []  Lysle Pearl, PharmD, BCPS []  Phillips Climes, PharmD, BCPS []  Agapito Games, PharmD, BCPS []  Verlan Friends, PharmD  Positive urine culture Treated with cephalexin, organism sensitive to the same and no further patient follow-up is required at this time.  Berle Mull 01/15/2018, 11:26 AM

## 2018-03-17 ENCOUNTER — Telehealth: Payer: Self-pay | Admitting: Emergency Medicine

## 2018-03-17 NOTE — Telephone Encounter (Signed)
Lost to followup 

## 2023-10-18 ENCOUNTER — Emergency Department (HOSPITAL_COMMUNITY): Payer: Self-pay

## 2023-10-18 ENCOUNTER — Encounter (HOSPITAL_COMMUNITY): Payer: Self-pay | Admitting: *Deleted

## 2023-10-18 ENCOUNTER — Other Ambulatory Visit: Payer: Self-pay

## 2023-10-18 ENCOUNTER — Emergency Department (HOSPITAL_COMMUNITY): Admission: EM | Admit: 2023-10-18 | Discharge: 2023-10-18 | Discharge status: Against Medical Advice | Payer: Self-pay

## 2023-10-18 DIAGNOSIS — Y92410 Unspecified street and highway as the place of occurrence of the external cause: Secondary | ICD-10-CM | POA: Insufficient documentation

## 2023-10-18 DIAGNOSIS — M542 Cervicalgia: Secondary | ICD-10-CM | POA: Insufficient documentation

## 2023-10-18 DIAGNOSIS — Z5321 Procedure and treatment not carried out due to patient leaving prior to being seen by health care provider: Secondary | ICD-10-CM | POA: Diagnosis not present

## 2023-10-18 DIAGNOSIS — R519 Headache, unspecified: Secondary | ICD-10-CM | POA: Insufficient documentation

## 2023-10-18 DIAGNOSIS — H538 Other visual disturbances: Secondary | ICD-10-CM | POA: Insufficient documentation
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
